# Patient Record
Sex: Female | Born: 1985 | Race: White | Hispanic: No | Marital: Single | Smoking: Former smoker
Health system: Southern US, Community
[De-identification: ages and names within clinical notes are randomized; demographics above are authoritative.]

## PROBLEM LIST (undated history)

## (undated) DIAGNOSIS — J45909 Unspecified asthma, uncomplicated: Secondary | ICD-10-CM

## (undated) DIAGNOSIS — F32A Depression, unspecified: Secondary | ICD-10-CM

## (undated) DIAGNOSIS — F329 Major depressive disorder, single episode, unspecified: Secondary | ICD-10-CM

## (undated) DIAGNOSIS — F909 Attention-deficit hyperactivity disorder, unspecified type: Secondary | ICD-10-CM

## (undated) DIAGNOSIS — D649 Anemia, unspecified: Secondary | ICD-10-CM

## (undated) DIAGNOSIS — K219 Gastro-esophageal reflux disease without esophagitis: Secondary | ICD-10-CM

## (undated) HISTORY — DX: Attention-deficit hyperactivity disorder, unspecified type: F90.9

## (undated) HISTORY — DX: Gastro-esophageal reflux disease without esophagitis: K21.9

## (undated) HISTORY — PX: TONSILLECTOMY: SUR1361

## (undated) HISTORY — DX: Major depressive disorder, single episode, unspecified: F32.9

## (undated) HISTORY — DX: Depression, unspecified: F32.A

## (undated) HISTORY — PX: DILATION AND CURETTAGE OF UTERUS: SHX78

## (undated) HISTORY — PX: WISDOM TOOTH EXTRACTION: SHX21

---
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
## 2006-01-19 ENCOUNTER — Ambulatory Visit: Payer: Self-pay | Admitting: Family Medicine

## 2006-04-05 ENCOUNTER — Ambulatory Visit: Payer: Self-pay | Admitting: Internal Medicine

## 2007-10-22 ENCOUNTER — Ambulatory Visit: Payer: Self-pay | Admitting: Family Medicine

## 2007-10-22 DIAGNOSIS — K219 Gastro-esophageal reflux disease without esophagitis: Secondary | ICD-10-CM | POA: Insufficient documentation

## 2007-10-22 DIAGNOSIS — F4310 Post-traumatic stress disorder, unspecified: Secondary | ICD-10-CM

## 2007-10-22 DIAGNOSIS — F909 Attention-deficit hyperactivity disorder, unspecified type: Secondary | ICD-10-CM | POA: Insufficient documentation

## 2007-10-22 DIAGNOSIS — J45909 Unspecified asthma, uncomplicated: Secondary | ICD-10-CM | POA: Insufficient documentation

## 2007-10-22 DIAGNOSIS — J309 Allergic rhinitis, unspecified: Secondary | ICD-10-CM | POA: Insufficient documentation

## 2007-12-03 ENCOUNTER — Telehealth (INDEPENDENT_AMBULATORY_CARE_PROVIDER_SITE_OTHER): Payer: Self-pay | Admitting: *Deleted

## 2007-12-07 ENCOUNTER — Ambulatory Visit: Payer: Self-pay | Admitting: Internal Medicine

## 2008-01-08 ENCOUNTER — Encounter: Payer: Self-pay | Admitting: Internal Medicine

## 2008-01-10 ENCOUNTER — Telehealth (INDEPENDENT_AMBULATORY_CARE_PROVIDER_SITE_OTHER): Payer: Self-pay | Admitting: *Deleted

## 2008-01-10 ENCOUNTER — Telehealth: Payer: Self-pay | Admitting: Internal Medicine

## 2008-02-07 ENCOUNTER — Telehealth: Payer: Self-pay | Admitting: Internal Medicine

## 2008-06-25 ENCOUNTER — Ambulatory Visit: Payer: Self-pay | Admitting: Internal Medicine

## 2008-07-02 ENCOUNTER — Telehealth: Payer: Self-pay | Admitting: Internal Medicine

## 2008-07-15 ENCOUNTER — Telehealth (INDEPENDENT_AMBULATORY_CARE_PROVIDER_SITE_OTHER): Payer: Self-pay | Admitting: *Deleted

## 2008-10-17 ENCOUNTER — Telehealth: Payer: Self-pay | Admitting: Internal Medicine

## 2008-11-24 ENCOUNTER — Encounter (INDEPENDENT_AMBULATORY_CARE_PROVIDER_SITE_OTHER): Payer: Self-pay | Admitting: *Deleted

## 2008-11-24 ENCOUNTER — Ambulatory Visit: Payer: Self-pay | Admitting: Internal Medicine

## 2008-11-24 DIAGNOSIS — D239 Other benign neoplasm of skin, unspecified: Secondary | ICD-10-CM | POA: Insufficient documentation

## 2008-11-24 DIAGNOSIS — L708 Other acne: Secondary | ICD-10-CM

## 2009-01-15 ENCOUNTER — Telehealth (INDEPENDENT_AMBULATORY_CARE_PROVIDER_SITE_OTHER): Payer: Self-pay | Admitting: *Deleted

## 2009-01-19 ENCOUNTER — Telehealth (INDEPENDENT_AMBULATORY_CARE_PROVIDER_SITE_OTHER): Payer: Self-pay | Admitting: *Deleted

## 2009-02-12 ENCOUNTER — Telehealth (INDEPENDENT_AMBULATORY_CARE_PROVIDER_SITE_OTHER): Payer: Self-pay | Admitting: *Deleted

## 2009-02-20 ENCOUNTER — Telehealth (INDEPENDENT_AMBULATORY_CARE_PROVIDER_SITE_OTHER): Payer: Self-pay | Admitting: *Deleted

## 2009-03-19 ENCOUNTER — Telehealth (INDEPENDENT_AMBULATORY_CARE_PROVIDER_SITE_OTHER): Payer: Self-pay | Admitting: *Deleted

## 2009-04-27 ENCOUNTER — Telehealth (INDEPENDENT_AMBULATORY_CARE_PROVIDER_SITE_OTHER): Payer: Self-pay | Admitting: *Deleted

## 2009-07-02 ENCOUNTER — Ambulatory Visit: Payer: Self-pay | Admitting: Internal Medicine

## 2009-09-03 ENCOUNTER — Telehealth (INDEPENDENT_AMBULATORY_CARE_PROVIDER_SITE_OTHER): Payer: Self-pay | Admitting: *Deleted

## 2009-10-02 ENCOUNTER — Telehealth (INDEPENDENT_AMBULATORY_CARE_PROVIDER_SITE_OTHER): Payer: Self-pay | Admitting: *Deleted

## 2009-12-02 ENCOUNTER — Telehealth (INDEPENDENT_AMBULATORY_CARE_PROVIDER_SITE_OTHER): Payer: Self-pay | Admitting: *Deleted

## 2009-12-09 ENCOUNTER — Ambulatory Visit: Payer: Self-pay | Admitting: Internal Medicine

## 2010-01-01 ENCOUNTER — Telehealth (INDEPENDENT_AMBULATORY_CARE_PROVIDER_SITE_OTHER): Payer: Self-pay | Admitting: *Deleted

## 2010-01-14 ENCOUNTER — Telehealth (INDEPENDENT_AMBULATORY_CARE_PROVIDER_SITE_OTHER): Payer: Self-pay | Admitting: *Deleted

## 2010-01-22 ENCOUNTER — Telehealth (INDEPENDENT_AMBULATORY_CARE_PROVIDER_SITE_OTHER): Payer: Self-pay | Admitting: *Deleted

## 2010-01-22 ENCOUNTER — Ambulatory Visit: Payer: Self-pay | Admitting: Internal Medicine

## 2010-01-27 ENCOUNTER — Encounter: Payer: Self-pay | Admitting: Internal Medicine

## 2010-02-01 ENCOUNTER — Telehealth (INDEPENDENT_AMBULATORY_CARE_PROVIDER_SITE_OTHER): Payer: Self-pay | Admitting: *Deleted

## 2010-02-12 ENCOUNTER — Telehealth (INDEPENDENT_AMBULATORY_CARE_PROVIDER_SITE_OTHER): Payer: Self-pay | Admitting: *Deleted

## 2010-02-12 ENCOUNTER — Ambulatory Visit: Payer: Self-pay | Admitting: Family

## 2010-06-05 ENCOUNTER — Inpatient Hospital Stay (HOSPITAL_COMMUNITY): Admission: AD | Admit: 2010-06-05 | Discharge: 2010-06-05 | Payer: Self-pay | Admitting: Obstetrics and Gynecology

## 2010-06-05 ENCOUNTER — Ambulatory Visit: Payer: Self-pay | Admitting: Gynecology

## 2010-06-05 DIAGNOSIS — O99340 Other mental disorders complicating pregnancy, unspecified trimester: Secondary | ICD-10-CM

## 2010-06-05 DIAGNOSIS — F410 Panic disorder [episodic paroxysmal anxiety] without agoraphobia: Secondary | ICD-10-CM

## 2010-08-01 ENCOUNTER — Inpatient Hospital Stay (HOSPITAL_COMMUNITY): Admission: AD | Admit: 2010-08-01 | Discharge: 2010-08-02 | Payer: Self-pay | Admitting: Obstetrics and Gynecology

## 2010-08-01 ENCOUNTER — Ambulatory Visit: Payer: Self-pay | Admitting: Family

## 2010-08-18 ENCOUNTER — Ambulatory Visit: Payer: Self-pay | Admitting: Obstetrics & Gynecology

## 2010-08-25 ENCOUNTER — Ambulatory Visit: Payer: Self-pay | Admitting: Gynecology

## 2010-08-25 ENCOUNTER — Inpatient Hospital Stay (HOSPITAL_COMMUNITY): Admission: AD | Admit: 2010-08-25 | Discharge: 2010-08-25 | Payer: Self-pay | Admitting: Obstetrics and Gynecology

## 2010-09-24 ENCOUNTER — Inpatient Hospital Stay (HOSPITAL_COMMUNITY)
Admission: AD | Admit: 2010-09-24 | Discharge: 2010-09-24 | Payer: Self-pay | Source: Home / Self Care | Admitting: Obstetrics and Gynecology

## 2010-09-27 ENCOUNTER — Inpatient Hospital Stay (HOSPITAL_COMMUNITY)
Admission: AD | Admit: 2010-09-27 | Discharge: 2010-09-27 | Payer: Self-pay | Source: Home / Self Care | Admitting: Obstetrics and Gynecology

## 2010-10-01 ENCOUNTER — Ambulatory Visit: Payer: Self-pay | Admitting: Obstetrics & Gynecology

## 2010-10-01 ENCOUNTER — Inpatient Hospital Stay (HOSPITAL_COMMUNITY)
Admission: AD | Admit: 2010-10-01 | Discharge: 2010-10-02 | Payer: Self-pay | Source: Home / Self Care | Admitting: Obstetrics and Gynecology

## 2010-10-09 ENCOUNTER — Inpatient Hospital Stay (HOSPITAL_COMMUNITY)
Admission: AD | Admit: 2010-10-09 | Discharge: 2010-10-13 | Payer: Self-pay | Source: Home / Self Care | Admitting: Obstetrics and Gynecology

## 2010-11-01 ENCOUNTER — Telehealth: Payer: Self-pay | Admitting: Internal Medicine

## 2010-11-05 ENCOUNTER — Telehealth: Payer: Self-pay | Admitting: Internal Medicine

## 2010-11-11 ENCOUNTER — Inpatient Hospital Stay (HOSPITAL_COMMUNITY): Admission: AD | Admit: 2010-11-11 | Discharge: 2010-08-18 | Payer: Self-pay | Admitting: Obstetrics and Gynecology

## 2010-11-17 ENCOUNTER — Ambulatory Visit: Payer: Self-pay | Admitting: Internal Medicine

## 2010-11-17 DIAGNOSIS — R519 Headache, unspecified: Secondary | ICD-10-CM | POA: Insufficient documentation

## 2010-11-17 DIAGNOSIS — R51 Headache: Secondary | ICD-10-CM

## 2010-11-17 DIAGNOSIS — F329 Major depressive disorder, single episode, unspecified: Secondary | ICD-10-CM

## 2010-12-16 ENCOUNTER — Telehealth: Payer: Self-pay | Admitting: Internal Medicine

## 2010-12-25 ENCOUNTER — Encounter: Payer: Self-pay | Admitting: Family Medicine

## 2010-12-29 ENCOUNTER — Emergency Department (HOSPITAL_COMMUNITY)
Admission: EM | Admit: 2010-12-29 | Discharge: 2010-12-29 | Payer: Self-pay | Source: Home / Self Care | Admitting: Emergency Medicine

## 2010-12-29 DIAGNOSIS — F329 Major depressive disorder, single episode, unspecified: Secondary | ICD-10-CM

## 2010-12-29 LAB — DIFFERENTIAL
Basophils Absolute: 0 10*3/uL (ref 0.0–0.1)
Basophils Relative: 0 % (ref 0–1)
Eosinophils Absolute: 0.1 10*3/uL (ref 0.0–0.7)
Eosinophils Relative: 1 % (ref 0–5)
Lymphocytes Relative: 28 % (ref 12–46)
Lymphs Abs: 2.6 10*3/uL (ref 0.7–4.0)
Monocytes Absolute: 0.8 10*3/uL (ref 0.1–1.0)
Monocytes Relative: 8 % (ref 3–12)
Neutro Abs: 5.8 10*3/uL (ref 1.7–7.7)
Neutrophils Relative %: 63 % (ref 43–77)

## 2010-12-29 LAB — COMPREHENSIVE METABOLIC PANEL
ALT: 26 U/L (ref 0–35)
AST: 34 U/L (ref 0–37)
Albumin: 4.2 g/dL (ref 3.5–5.2)
Alkaline Phosphatase: 58 U/L (ref 39–117)
BUN: 6 mg/dL (ref 6–23)
CO2: 25 mEq/L (ref 19–32)
Calcium: 9.9 mg/dL (ref 8.4–10.5)
Chloride: 108 mEq/L (ref 96–112)
Creatinine, Ser: 0.74 mg/dL (ref 0.4–1.2)
GFR calc Af Amer: >60 mL/min (ref >60)
GFR calc non Af Amer: >60 mL/min (ref >60)
Glucose, Bld: 82 mg/dL (ref 70–99)
Potassium: 3.8 mEq/L (ref 3.5–5.1)
Sodium: 141 mEq/L (ref 135–145)
Total Bilirubin: 0.6 mg/dL (ref 0.3–1.2)
Total Protein: 7.1 g/dL (ref 6.0–8.3)

## 2010-12-29 LAB — CBC
HCT: 36.4 % (ref 36.0–46.0)
Hemoglobin: 12.7 g/dL (ref 12.0–15.0)
MCH: 27.9 pg (ref 26.0–34.0)
MCHC: 34.9 g/dL (ref 30.0–36.0)
MCV: 80 fL (ref 78.0–100.0)
Platelets: 255 10*3/uL (ref 150–400)
RBC: 4.55 MIL/uL (ref 3.87–5.11)
RDW: 12.8 % (ref 11.5–15.5)
WBC: 9.3 10*3/uL (ref 4.0–10.5)

## 2010-12-29 LAB — RAPID URINE DRUG SCREEN, HOSP PERFORMED
Amphetamines: POSITIVE — AB
Barbiturates: NOT DETECTED
Benzodiazepines: POSITIVE — AB
Cocaine: NOT DETECTED
Opiates: POSITIVE — AB
Tetrahydrocannabinol: NOT DETECTED

## 2010-12-29 LAB — PREGNANCY, URINE: Preg Test, Ur: NEGATIVE

## 2010-12-29 LAB — SALICYLATE LEVEL: Salicylate Lvl: <4 mg/dL (ref 2.8–20.0)

## 2010-12-29 LAB — ETHANOL: Alcohol, Ethyl (B): <5 mg/dL (ref 0–10)

## 2010-12-29 LAB — ACETAMINOPHEN LEVEL: Acetaminophen (Tylenol), Serum: <10 ug/mL — ABNORMAL LOW (ref 10–30)

## 2010-12-30 NOTE — Consult Note (Addendum)
Connie Lewis, Connie Lewis            ACCOUNT NO.:  [id number]  MEDICAL RECORD NO.:  [id number]          PATIENT TYPE:  EMS  LOCATION:  ED                           FACILITY:  [facility]  PHYSICIAN:  Eulogio Ditch, MD DATE OF BIRTH:  Dec 30, 1985  DATE OF CONSULTATION:  12/29/2010 DATE OF DISCHARGE:  12/29/2010                                CONSULTATION   REASON FOR CONSULTATION:  Depressed mood/overdose.  HISTORY OF PRESENT ILLNESS:  A 24 year old white female who came to [facility] ED after she took 10 pills of Wellbutrin.  The patient told me it was a stupid act; she did not want to kill herself after taking the pills.  She called her father and her father brought her to the hospital.  The patient is on Wellbutrin 450 mg XL prescribed by her OB/GYN doctor in the outpatient setting.  The patient told me that she was going through a conflict with her boyfriend/fiance, who has a drinking problem, and that is why she became upset and took these pills. But, at this time, she told me that she had no intention to kill herself.  It was not planned or an intentional act.  The patient is very logical and goal directed during the interview, is not internally preoccupied, is not having any psychotic or manic symptoms.  PAST PSYCHIATRIC HISTORY:  The patient has a history of depression and is on Wellbutrin.  She was admitted one time at the age of 52 for suicidal ideation.  Currently, she is not being followed by a psychiatrist or counselor in the outpatient setting.  SUBSTANCE ABUSE HISTORY:  None.  SOCIAL HISTORY:  The patient lives with her fiance.  She has an 59-week- old son.  The patient told me she is worried that who is going to take care of her son if she is admitted to behavioral health.  She does not want to be admitted to behavioral health at this time because of this reason.  MENTAL STATUS EXAM:  The patient is calm, cooperative during the interview.  Fair eye contact,  pleasant on approach.  Mood is euthymic. Affect mood congruent.  Thought process logical and goal directed. Speech normal.  No psychomotor agitation or retardation noted.  Hygiene and grooming normal.  Thought content not suicidal or homicidal, not delusional.  Thought perception:  No audiovisual hallucination reported, not internally preoccupied.  Cognition:  Alert, awake and oriented x3. Memory:  Immediate, recent and remote intact.  Fund of knowledge is fair.  Attention and concentration are good.  Abstraction ability is good.  Insight and judgment intact.  DIAGNOSES:  AXIS I:  Depressive disorder, not otherwise specified.  Rule out major depressive disorder, recurrent type. AXIS II:  Deferred. AXIS III:  See medical notes. AXIS IV:  Recent conflict with the fiance. AXIS V:  51.  RECOMMENDATIONS: 1. I spoke with her father, who at this time has no safety concern for     the patient and he does not think that the patient was trying to     kill herself or going to kill herself in the future.  Father wants  to take the patient home at this time and agreed to pay for the     therapy in the outpatient setting. 2. I told [facility] member, Clydie Braun, to speak with the father and the patient     together and to determine the safety plan and to follow up in the     outpatient setting.  Clydie Braun spoke with me later on that she had     given the family all the information that if she feels depressed or     suicidal where she can call, and she can call the ER and can come     back.  At this time, the counselor and myself have gone over all     the safety concerns for the patient, and we have decided the     patient can be followed in the outpatient setting as she is not     acutely suicidal, manic or psychotic.  She has an 83-week-old son     and she want to go and take care of the son.  Father is also     involved in the treatment, so this patient can be managed outside     at this  time.     Eulogio Ditch, MD     SA/MEDQ  D:  12/29/2010  T:  12/29/2010  Job:  [id number]  Electronically Signed by Eulogio Ditch  on 12/30/2010 05:33:07 AM

## 2011-01-04 NOTE — Medication Information (Signed)
Summary: Approval for Singulair/[facility]  Approval for Singulair/[facility]   Imported By: Lanelle Bal 02/01/2010 12:54:57  _____________________________________________________________________  External Attachment:    Type:   Image     Comment:   External Document

## 2011-01-04 NOTE — Assessment & Plan Note (Signed)
Summary: allergies//lch   Vital Signs:  Patient profile:   25 year old female Height:      67 inches Weight:      145 pounds Temp:     98.8 degrees F BP sitting:   110 / 70  Vitals Entered By: Shary Decamp (January 22, 2010 2:45 PM) CC: ALLERGY SXS, NO RELIEF WITH CLARITIN   History of Present Illness: was seen 12/09/2009 prescribed antibiotics and steroids she did not improve the steroids keep her up all night  she continue with allergy symptoms: Eyes swollen and itching nose running and itching (+) sneezing (+)  frontal headache which is intense at times  Current Medications (verified): 1)  Aviane 0.1-20 Mg-Mcg Tabs (Levonorgestrel-Ethinyl Estrad) .... Once Daily 2)  Amphetamine Salt Combo 20 Mg  Tabs (Amphetamine-Dextroamphetamine) .... Bid 3)  Mvi 4)  Claritin 10 Mg Tabs (Loratadine) .Marland Kitchen.. 1 By Mouth Daily (Otc)  Allergies (verified): No Known Drug Allergies  Review of Systems      See HPI       she had a virus last week, had fever and cough the acute symptoms are resolving at this point  Physical Exam  General:  alert and well-developed.   Head:  face symmetric, tender in both maxillary sinuses Eyes:  EOMI Ears:  R ear normal and L ear normal.   Nose:  slightly congested Mouth:  no redness or discharge tonge normal to inspection Lungs:  normal respiratory effort, no intercostal retractions, no accessory muscle use, and normal breath sounds.     Impression & Recommendations:  Problem # 1:  ALLERGIC RHINITIS (ICD-477.9) symptoms increase for several weeks she still has some evidence of acute maxillary sinusitis Plan: Switch to Zyrtec Singulair, astepro, avelox  needs to change enviroment (moved to old appartment few months ago) The following medications were removed from the medication list:    Nasacort Aq 55 Mcg/act Aers (Triamcinolone acetonide(nasal)) .Marland Kitchen... 2 puff on each side of the nose once daily Her updated medication list for this problem  includes:    Zyrtec Allergy 10 Mg Caps (Cetirizine hcl) .Marland Kitchen... 1 a day    Astepro 0.15 % Soln (Azelastine hcl) .Marland Kitchen... 2 sprays in each side of the nose twice a day  Problem # 2:  MAXILLARY SINUSITIS (ICD-473.0) see #1 The following medications were removed from the medication list:    Nasacort Aq 55 Mcg/act Aers (Triamcinolone acetonide(nasal)) .Marland Kitchen... 2 puff on each side of the nose once daily    Amoxicillin 500 Mg Caps (Amoxicillin) .Marland Kitchen... 2 by mouth two times a day x 10 days Her updated medication list for this problem includes:    Astepro 0.15 % Soln (Azelastine hcl) .Marland Kitchen... 2 sprays in each side of the nose twice a day    Avelox 400 Mg Tabs (Moxifloxacin hcl) .Marland Kitchen... 1    by mouth daily  Complete Medication List: 1)  Aviane 0.1-20 Mg-mcg Tabs (Levonorgestrel-ethinyl estrad) .... Once daily 2)  Amphetamine Salt Combo 20 Mg Tabs (Amphetamine-dextroamphetamine) .... Bid 3)  Mvi  4)  Zyrtec Allergy 10 Mg Caps (Cetirizine hcl) .Marland Kitchen.. 1 a day 5)  Singulair 10 Mg Tabs (Montelukast sodium) .... One p.o. daily 6)  Astepro 0.15 % Soln (Azelastine hcl) .... 2 sprays in each side of the nose twice a day 7)  Avelox 400 Mg Tabs (Moxifloxacin hcl) .Marland Kitchen.. 1    by mouth daily 8)  Alocril 2 % Soln (Nedocromil sodium) .... 2 drops on  each eye b.i.d. 9)  Fluconazole 150  Mg Tabs (Fluconazole) .Marland Kitchen.. 1 by mouth once daily x 2  Patient Instructions: 1)  start  ZYRTEC OTC one a day 2)  start SINGULAIR 10 MG one with a 3)  start ASTEPRO  2 sprays in each side of the nose twice a day 4)  ALOCRIL   2 drops on  each eye two times a day  5)  AVELOX 400 MG x 1 wek  6)   call if no better in two weeks Prescriptions: FLUCONAZOLE 150 MG TABS (FLUCONAZOLE) 1 by mouth once daily x 2  #2 x 0   Entered and Authorized by:   [name]   Signed by:   [name] on 01/22/2010   Method used:   [facility]        [facility] [phone]* (retail)       [address]       [place], [place]  [address]       Ph: [phone]       Fax: [phone]   RxID:   [id number] ALOCRIL 2 % SOLN (NEDOCROMIL SODIUM) 2 drops on  each eye b.i.d.  #1 x 3   Entered and Authorized by:   [name]   Signed by:   [name] on 01/22/2010   Method used:   Electronically to        [facility] [phone]* (retail)       [address]       [place], [place]  [address]       Ph: [phone]       Fax: [phone]   RxID:   [id number] AVELOX 400 MG TABS (MOXIFLOXACIN HCL) 1    by mouth daily  #7 x 0   Entered and Authorized by:   [name]   Signed by:   [name] on 01/22/2010   Method used:   Electronically to        [facility] [phone]* (retail)       [address]       [place], [place]  [address]       Ph: [phone]       Fax: [phone]   RxID:   [id number] ASTEPRO 0.15 % SOLN (AZELASTINE HCL) 2 sprays in each side of the nose twice a day  #1 x 3   Entered and Authorized by:   [name]   Signed by:   [name] on 01/22/2010   Method used:   Electronically to        [facility] [phone]* (retail)       [address]       [place], [place]  [address]       Ph: [phone]       Fax: [phone]   RxID:   [id number] SINGULAIR 10 MG TABS (MONTELUKAST SODIUM) one p.o. daily  #30 x 3   Entered and Authorized by:   [name]   Signed by:   [name] on 01/22/2010   Method used:   Electronically to        [facility] [phone]* (retail)       [address]  [place], [place]  [address]       Ph: [phone]       Fax: [phone]   RxID:   [id number]

## 2011-01-04 NOTE — Progress Notes (Signed)
Summary: singulair approved  Phone Note Refill Request Message from:  Fax from Pharmacy on January 22, 2010 4:06 PM  Refills Requested: Medication #1:  SINGULAIR 10 MG TABS one p.o. daily prior auth fax [phone]   Method Requested: Fax to Local Pharmacy Initial call taken by: Barb Merino,  January 22, 2010 4:07 PM  Follow-up for Phone Call        prior auth initiated with [facility] Shary Decamp  January 22, 2010 4:26 PM  Medication has been approved from 01/27/10 until 01/27/11 -- pharmacy advised Shary Decamp  January 27, 2010 3:19 PM

## 2011-01-04 NOTE — Progress Notes (Signed)
Summary: DOES SHE NEED ANOTHER APPOINTMENT  Phone Note Call from Patient Call back at Home Phone [phone]   Caller: Patient Summary of Call: PATIENT SAW DR PAZ THIS WEEK AND THEY DISCUSSED HER AMPHET MEDICATION----SINCE SHE JUST SAW DR PAZ, DOES SHE NEED TO SCHEDULE A NEW APPOINTMENT SOON??   PLEASE LET HER KNOW Initial call taken by: Jerolyn Shin,  January 01, 2010 4:39 PM  Follow-up for Phone Call        pt informed she does not need appt, amphentamine rx was discussed at [facility]  12/09/09 Follow-up by: Kandice Hams,  January 01, 2010 4:52 PM

## 2011-01-04 NOTE — Medication Information (Signed)
Summary: Prior Authorization for Singulair/[facility]  Prior Authorization for [employer]   Imported By: Lanelle Bal 01/27/2010 11:39:48  _____________________________________________________________________  External Attachment:    Type:   Image     Comment:   External Document

## 2011-01-04 NOTE — Progress Notes (Signed)
Summary: adderall rx  Phone Note Refill Request Call back at Home Phone [phone] Message from:  Patient  Refills Requested: Medication #1:  AMPHETAMINE SALT COMBO 20 MG  TABS bid Initial call taken by: Kandice Hams,  February 01, 2010 2:13 PM  Follow-up for Phone Call        pt informed rx will be ready for pickup today.Kandice Hams  February 01, 2010 2:19 PM  Follow-up by: Kandice Hams,  February 01, 2010 2:19 PM    Prescriptions: AMPHETAMINE SALT COMBO 20 MG  TABS (AMPHETAMINE-DEXTROAMPHETAMINE) bid  #60 x 0   Entered by:   Kandice Hams   Authorized by:   [name]   Signed by:   Kandice Hams on 02/01/2010   Method used:   Print then Give to Patient   RxID:   [id number]

## 2011-01-04 NOTE — Progress Notes (Signed)
Summary:  [facility]  Phone Note Outgoing Call   Call placed by: Jeremy Johann CMA,  November 05, 2010 11:39 AM Details for Reason: [facility] Triage Call Report Triage Record Num: [id number] Operator: Peri Jefferson Patient Name: Connie Lewis Call Date & Time: 11/04/2010 8:47:30PM Patient Phone: [phone] PCP: Sonda Primes Patient Gender: Female PCP Fax : [phone] Patient DOB: June 23, 1986 Practice Name: [facility] Reason for Call: Mardella Layman calling because she developed facial burning and itching approx 1 1/2 hrs ago. States that her face was red. Face and neck are still red with bumps. Looks like hives. Afebrile. Did take Vicodin for a HA today but has taken Vicodin before. Face and lips are swollen but no difficulty breathing. Advised ED. Protocol(s) Used: Hives Recommended Outcome per Protocol: Call Provider within 4 Hours Reason for Outcome: First episode of hives with no other symptoms occurring within minutes to several hours after exposure to an allergen Care Advice: Call EMS 911 if develop signs and symptoms of anaphylaxis within minutes to several hours of exposure: severe difficulty breathing; rapid, weak or irregular pulse; pruritus, urticaria, swelling of face, lips, tongue, or throat causing tightness or difficulty swallowing; abdominal cramping, nausea, vomiting or diarrhea.  ~ 12/ Summary of Call: left message to call office to check status of pt. No documentation in E-chart that pt went to ED...........Marland KitchenFelecia Deloach CMA  November 05, 2010 11:42 AM   Pt states that she did not go to ED. Pt took benadryl and waited it out. Rash and all other symptoms have resolved. Pt advise to call office if symptoms return ...........Marland KitchenFelecia Deloach CMA  November 08, 2010 1:49 PM      Appended Document:  [facility] please rec a OV,  we need to try to see what caused the reaction  Appended Document: lm    12/6    Phone  Note Other Incoming   Details for Reason: please rec a OV,  we need to try to see what caused the reaction [name] MD  November 11, 2010 10:27 AM  Summary of Call: left message on voicemail to call back to office. Lucious Groves CMA  November 09, 2010 11:34 AM   Follow-up for Phone Call        I spoke w/ pt she states that she has had no further reactions. She has a newborn baby and it is hard to go places. She declines an appt right now.  Follow-up by: Army Fossa CMA,  November 09, 2010 1:32 PM  Additional Follow-up for Phone Call Additional follow up Details #1::        noted  Additional Follow-up by: [facility] Paz MD,  November 11, 2010 10:27 AM

## 2011-01-04 NOTE — Progress Notes (Signed)
Summary: FLU SX/HOP CAN YOU ADVISE PLEASE IN ABSENCE OF PAZ  Phone Note Call from Patient   Caller: Patient Summary of Call: pt called and left msg, sister dx with flu and she has signs, works at a pediatric office and wanted to know if she can rx sent to [facility] without office visit. called and got VM, left msg for pt to call .Kandice Hams  January 14, 2010 1:24  PT CALLED BACK SISTER WAS DX WITH THE FLU, PT HAS FLU SX FEVER 100.2, COUGH BODY ACHES,  [facility] [address]  Initial call taken by: Kandice Hams,  January 14, 2010 1:24 PM  Follow-up for Phone Call        Tamiflu 75 mg two times a day #10 Follow-up by: Marga Melnick MD,  January 14, 2010 3:49 PM  Additional Follow-up for Phone Call Additional follow up Details #1::        Left message on VM informing patient RX sent in, Ok'd by Dr.Hopper b/c Dr.Paz is out of the office Additional Follow-up by: Shonna Chock,  January 14, 2010 5:16 PM    New/Updated Medications: TAMIFLU 75 MG CAPS (OSELTAMIVIR PHOSPHATE) 1 by mouth two times a day Prescriptions: TAMIFLU 75 MG CAPS (OSELTAMIVIR PHOSPHATE) 1 by mouth two times a day  #10 x 0   Entered by:   Shonna Chock   Authorized by:   Marga Melnick MD   Signed by:   Shonna Chock on 01/14/2010   Method used:   Electronically to        [facility] [phone]* (retail)       [address]       [place], [place]  [address]       Ph: [phone]       Fax: [phone]   RxID:   [id number]

## 2011-01-04 NOTE — Progress Notes (Signed)
  Phone Note Call from Patient Call back at [facility] Phone [phone]   Summary of Call: Pt called had appt today with NP, and missed it, found out she is pregnant, taking multiple meds. Has appt scheduled with OB next month, wanted to know what meds she can and cannot take, she has stopped all meds for now,  Recommend pt to s/w OB nurse who can tell her what meds she should not be taking 1st trimester pt agreed will call on monday.Kandice Hams  February 12, 2010 4:51 PM  Initial call taken by: Kandice Hams,  February 12, 2010 4:51 PM

## 2011-01-04 NOTE — Assessment & Plan Note (Signed)
Summary: sinus infection/kdc   Vital Signs:  Patient profile:   25 year old female Height:      67 inches Weight:      145.8 pounds BMI:     22.92 Temp:     98.5 degrees F oral BP sitting:   110 / 80  (left arm) Cuff size:   large  Vitals Entered By: Shary Decamp (December 09, 2009 2:14 PM) CC: acute only Comments  - feels like she has had a sinus infection for a "couple of month" - has had HA, sinus pain, pressure  - eyes sore  - has had blisters in her mouth, around lips x couple weeks Shary Decamp  December 09, 2009 2:15 PM    History of Present Illness: acute only    - feels like she has had a sinus infection for a "couple of month" - has had a frontal HA, sinus pain, pressure  - eyes sore  - has had blisters in her mouth, around lips x couple weeks  Current Medications (verified): 1)  Loestrin 1/20 (21) 1-20 Mg-Mcg  Tabs (Norethindrone Acet-Ethinyl Est) 2)  Amphetamine Salt Combo 20 Mg  Tabs (Amphetamine-Dextroamphetamine) .... Bid 3)  Nasacort Aq 55 Mcg/act Aers (Triamcinolone Acetonide(Nasal)) .... 2 Puff On Each Side of The Nose Once Daily 4)  Mvi  Allergies (verified): No Known Drug Allergies  Past History:  Past Medical History: Reviewed history from 11/24/2008 and no changes required. ADHD Allergic rhinitis Asthma GERD G0  Review of Systems General:  Denies chills and fever. ENT:  nasal d/c-- usually clear. Resp:  on-off cough, chest congestion. Allergy:  (+) itchy eyes and nose moved to an old appartment in July 2010,patient wonders if that is making her allergies worse.  Physical Exam  General:  alert, well-developed, and well-nourished.   Head:  face symetric, tender at maxilary sinuses L>R Nose:  congested  Mouth:  no red or d/c tongue normal, slightly  prominent vallate papillae Lungs:  normal respiratory effort, no intercostal retractions, no accessory muscle use, and normal breath sounds.   Heart:  normal rate, regular rhythm, and no  murmur.     Impression & Recommendations:  Problem # 1:  MAXILLARY SINUSITIS (ICD-473.0) allergic and/or allergic sinusitis moved to an old appartment which may be exhacerbating her symptoms plan: abx -steroid-claritin likes a diflucan Rx as well: done  Her updated medication list for this problem includes:    Nasacort Aq 55 Mcg/act Aers (Triamcinolone acetonide(nasal)) .Marland Kitchen... 2 puff on each side of the nose once daily    Amoxicillin 500 Mg Caps (Amoxicillin) .Marland Kitchen... 2 by mouth two times a day x 10 days  Problem # 2:  ADHD (ICD-314.01) likes to change to extended release as long as it is generic will call and ask for the ER version of her amphetamines in few weeks when she is due for RF and will try that  Complete Medication List: 1)  Loestrin 1/20 (21) 1-20 Mg-mcg Tabs (Norethindrone acet-ethinyl est) 2)  Amphetamine Salt Combo 20 Mg Tabs (Amphetamine-dextroamphetamine) .... Bid 3)  Nasacort Aq 55 Mcg/act Aers (Triamcinolone acetonide(nasal)) .... 2 puff on each side of the nose once daily 4)  Mvi  5)  Amoxicillin 500 Mg Caps (Amoxicillin) .... 2 by mouth two times a day x 10 days 6)  Prednisone 10 Mg Tabs (Prednisone) .... 4 by mouth once daily x2, 3x2, 2x2, 1x1 7)  Claritin 10 Mg Tabs (Loratadine) .Marland Kitchen.. 1 by mouth daily (otc) 8)  Diflucan 150 Mg Tabs (Fluconazole) .Marland Kitchen.. 1 by mouth once daily x 1  Patient Instructions: 1)  amoxicillin, antibiotic 2)  nasal sprays every day 3)  claritin OTC 4)  prednisone x few days 5)  call if no better in few days or if symptoms resurface  Prescriptions: DIFLUCAN 150 MG TABS (FLUCONAZOLE) 1 by mouth once daily x 1  #1 x 0   Entered and Authorized by:   Elita Quick E. Simon Aaberg MD   Signed by:   Nolon Rod. Thomasine Klutts MD on 12/09/2009   Method used:   Electronically to        [facility] [phone]* (retail)       [address]       [place], [place]  [address]       Ph: [phone]       Fax: [phone]   RxID:    [id number] PREDNISONE 10 MG TABS (PREDNISONE) 4 by mouth once daily x2, 3x2, 2x2, 1x1  #20 x 0   Entered and Authorized by:   Elita Quick E. Cyncere Ruhe MD   Signed by:   Nolon Rod. Samaia Iwata MD on 12/09/2009   Method used:   Electronically to        [facility] [phone]* (retail)       [address]       [place], [place]  [address]       Ph: [phone]       Fax: [phone]   RxID:   [id number] AMOXICILLIN 500 MG CAPS (AMOXICILLIN) 2 by mouth two times a day x 10 days  #40 x 0   Entered and Authorized by:   Nolon Rod. Amirr Achord MD   Signed by:   Nolon Rod. Autum Benfer MD on 12/09/2009   Method used:   Electronically to        [facility] [phone]* (retail)       [address]       [place], [place]  [address]       Ph: [phone]       Fax: [phone]   RxID:   [id number]

## 2011-01-04 NOTE — Progress Notes (Signed)
Summary: Adderall and breast feeding  Phone Note Call from Patient Call back at Home Phone [phone]   Summary of Call: Patient has had her baby (3 weeks ago) and will be returning back to school in [place]. Patient would like to know if it is ok to resume the Adderall and can she take it while breast feeding. Her gyn advised that she call her pcp. She notes that Adderall has worked the best for her and she is aware that she will most likely need appt to resume the med. Please advise. Initial call taken by: Lucious Groves CMA,  November 01, 2010 2:32 PM  Follow-up for Phone Call        Adderall IS NOT  safe during breast-feeding. I don't know of any safe medicines for the treatment of ADHD during breast-feeding Follow-up by: Keishaun Hazel E. Salik Grewell MD,  November 01, 2010 5:19 PM  Additional Follow-up for Phone Call Additional follow up Details #1::        left message on voicemail to call back to office. Lucious Groves CMA  November 02, 2010 8:20 AM   Patient notified. Lucious Groves CMA  November 02, 2010 8:37 AM

## 2011-01-05 ENCOUNTER — Ambulatory Visit (INDEPENDENT_AMBULATORY_CARE_PROVIDER_SITE_OTHER): Payer: 59 | Admitting: Internal Medicine

## 2011-01-05 ENCOUNTER — Encounter: Payer: Self-pay | Admitting: Internal Medicine

## 2011-01-05 DIAGNOSIS — F329 Major depressive disorder, single episode, unspecified: Secondary | ICD-10-CM

## 2011-01-05 DIAGNOSIS — M549 Dorsalgia, unspecified: Secondary | ICD-10-CM

## 2011-01-05 DIAGNOSIS — R259 Unspecified abnormal involuntary movements: Secondary | ICD-10-CM

## 2011-01-05 DIAGNOSIS — F909 Attention-deficit hyperactivity disorder, unspecified type: Secondary | ICD-10-CM

## 2011-01-06 ENCOUNTER — Encounter: Payer: Self-pay | Admitting: Internal Medicine

## 2011-01-06 ENCOUNTER — Ambulatory Visit (INDEPENDENT_AMBULATORY_CARE_PROVIDER_SITE_OTHER)
Admission: RE | Admit: 2011-01-06 | Discharge: 2011-01-06 | Disposition: A | Discharge status: Home / Self Care | Payer: 59 | Source: Ambulatory Visit | Attending: Internal Medicine | Admitting: Internal Medicine

## 2011-01-06 ENCOUNTER — Other Ambulatory Visit: Payer: Self-pay | Admitting: Internal Medicine

## 2011-01-06 DIAGNOSIS — M549 Dorsalgia, unspecified: Secondary | ICD-10-CM

## 2011-01-06 NOTE — Assessment & Plan Note (Signed)
Summary: med check/cbs-rescd cbs   Vital Signs:  Patient profile:   25 year old female Weight:      177.38 pounds BMI:     27.88 Temp:     98.6 degrees F oral Pulse rate:   101 / minute Pulse rhythm:   regular BP sitting:   100 / 74  (left arm) Cuff size:   large  Vitals Entered By: Army Fossa CMA (November 17, 2010 2:00 PM) CC: Discuss adderall    History of Present Illness:  ROV since the last office visit, her pregnancy went well, she had a vaginal delivery 5 weeks ago. She was off ADD medications and is ready to go back.  During her pregnancy she developed migraine headaches, was prescribed Vicodin and Xanax ( per patient) which she takes rarely now  she was also prescribed Wellbutrin for depression during her pregnancy, she also felt slightly blue in the postpartum period.   Allergies: No Known Drug Allergies  Past History:  Past Medical History: G1P1 migraine HAs, dx during pregnancy 2011 (on vicodin -xanax) ADHD Allergic rhinitis Asthma GERD Depression,  dx during her pregnancy in 2000 and  Past Surgical History: no major surgeries   Social History: engaged tobacco-- quit ETOH-- rarely   2 jobs plus college  Review of Systems Psych:  depression well controlled No anxiety ADD not well controlled at present, while she was pregnant   and off medication she tried to work and study and it was quite difficult. Allergy:  allergies much improved.  Physical Exam  General:  alert, well-developed, and well-nourished.   Lungs:  normal respiratory effort, no intercostal retractions, no accessory muscle use, and normal breath sounds.   Heart:  normal rate, regular rhythm, and no murmur.   Extremities:  no lower extremity edema Psych:  Oriented X3, memory intact for recent and remote, normally interactive, good eye contact, not anxious appearing, and not depressed appearing.     Impression & Recommendations:  Problem # 1:  ADHD (ICD-314.01) patient  was not taking medication  while  pregnant, she plans to go back to work and study in January and likes to restart her medication then. She is still breast-feeding but will stop once she starts medication. she knows there is a contraindication between adderall  and breast-feeding she was taking adderal 20 mg generic, would like to  retry the  extended release. I talked to  her about the shortage of medications and possibly trying Vyvanse but she is not enthusiastic about it. She reminded me that she cannot take ritalin or straterra plan: Rx adderall xr 20 ( I did initially a Rx for generic adderal, it was  destroyed and gave her a bran medically neccesary Rx)  Problem # 2:  HEADACHE (ICD-784.0)  in my opinion Vicodin and Xanax of not  good alternatives for  migraine treatment. Pt aware  Problem # 3:  DEPRESSION (ICD-311) on Wellbutrin, symptoms well controlled per patient Her updated medication list for this problem includes:    Wellbutrin Xl 150 Mg Xr24h-tab (Bupropion hcl) .Marland Kitchen... 2 am, 1 pm  Complete Medication List: 1)  Mvi  2)  Zyrtec Allergy 10 Mg Caps (Cetirizine hcl) .Marland Kitchen.. 1 a day 3)  Singulair 10 Mg Tabs (Montelukast sodium) .... One p.o. daily 4)  Wellbutrin Xl 150 Mg Xr24h-tab (Bupropion hcl) .... 2 am, 1 pm 5)  Adderall Xr 20 Mg Xr24h-cap (Amphetamine-dextroamphetamine) .Marland Kitchen.. 1 by mouth once daily  Patient Instructions: 1)  do not take Adder  or any ADHD medication if you are breast-feed. 2)  Please come back in 3 months for reassessment Prescriptions: ADDERALL XR 20 MG XR24H-CAP (AMPHETAMINE-DEXTROAMPHETAMINE) 1 by mouth once daily Brand medically necessary #30 x 0   Entered and Authorized by:   [name]   Signed by:   [name] on 11/17/2010   Method used:   Print then Give to Patient   RxID:   [id number] AMPHETAMINE-DEXTROAMPHETAMINE 20 MG XR24H-CAP (AMPHETAMINE-DEXTROAMPHETAMINE) 1 by mouth once daily  #30 x 0   Entered and Authorized by:   [name]    Signed by:   [name] on 11/17/2010   Method used:   Print then Give to Patient   RxID:   [id number]    Orders Added: 1)  Est. Patient Level III [44034]

## 2011-01-06 NOTE — Progress Notes (Signed)
Summary: Adderall dose  Phone Note Call from Patient Call back at Home Phone [phone]   Summary of Call: Patient left message on triage that she has been taking the Adderall and what she was given doesn't seem to be working as well as it used to. She ? if she needs higher dose. Please advise. Initial call taken by: Lucious Groves CMA,  December 16, 2010 2:07 PM  Follow-up for Phone Call        we can go up to 30mg . if that is not working, may need a [address] E. Gerard Bonus MD  December 17, 2010 9:16 AM   Additional Follow-up for Phone Call Additional follow up Details #1::        Left message on voicemail notifying the patient. Lucious Groves CMA  December 17, 2010 10:24 AM     New/Updated Medications: ADDERALL XR 30 MG XR24H-CAP (AMPHETAMINE-DEXTROAMPHETAMINE) 1 by mouth once daily Prescriptions: ADDERALL XR 30 MG XR24H-CAP (AMPHETAMINE-DEXTROAMPHETAMINE) 1 by mouth once daily  #30 x 0   Entered and Authorized by:   Nolon Rod. Kashauna Celmer MD   Signed by:   Lucious Groves CMA on 12/17/2010   Method used:   Print then Give to Patient   RxID:   [id number]

## 2011-01-07 ENCOUNTER — Telehealth (INDEPENDENT_AMBULATORY_CARE_PROVIDER_SITE_OTHER): Payer: Self-pay | Admitting: *Deleted

## 2011-01-10 ENCOUNTER — Telehealth: Payer: Self-pay | Admitting: Internal Medicine

## 2011-01-12 NOTE — Progress Notes (Signed)
Summary: labs/ Med Question  Phone Note Outgoing Call   Summary of Call: received labs from [facility]: CBC normal, TSH 1.3 advise patient: labs wnl Connie E. Paz MD  January 07, 2011 11:14 AM   Follow-up for Phone Call        Patient is aware of lab results.   She did have one question though about her adderall. She is on 30mg  ER and she said it is only lasting her about 6 hours at a time. She says that some days she has night classes and the medicine is well out of her system by then. She wanted to know if there was something she could do about that. Please advise.  Follow-up by: [name],  January 07, 2011 11:22 AM  Additional Follow-up for Phone Call Additional follow up Details #1::        options are: Take the Adderall XR 30 mg later in the day the times she has classes. We could add  Adderall 10 mg to take one tablet  in the afternoon the days she has nighttime classes. it would be a trial and see if that works Additional Follow-up by: [name],  January 07, 2011 11:28 AM    Additional Follow-up for Phone Call Additional follow up Details #2::    Spoke with patient and she would prefer to add the adderall 10mg . She said she has tried to take the 30 ER later in the day and it didnt help. She needs the new 10mg  rx to be generic. She just needs a phone call when it is ready to pick up.  Follow-up by: [name],  January 07, 2011 11:32 AM  Additional Follow-up for Phone Call Additional follow up Details #3:: Details for Additional Follow-up Action Taken: done  [name] MD  January 07, 2011 1:23 PM   Rx will be placed up front for pick-up, patient has been notified.[name]  January 07, 2011 1:31 PM  New/Updated Medications: AMPHETAMINE-DEXTROAMPHETAMINE 10 MG TABS (AMPHETAMINE-DEXTROAMPHETAMINE) one by mouth in the afternoon as needed Prescriptions: AMPHETAMINE-DEXTROAMPHETAMINE 10 MG TABS (AMPHETAMINE-DEXTROAMPHETAMINE) one by mouth in the afternoon  as needed  #30 x 0   Entered and Authorized by:   [name]   Signed by:   [name] on 01/07/2011   Method used:   Print then Give to Patient   RxID:   [id number]

## 2011-01-12 NOTE — Assessment & Plan Note (Signed)
Summary: backpain/kn   Vital Signs:  Patient profile:   25 year old female Height:      67 inches (170.18 cm) Weight:      169.38 pounds (76.99 kg) BMI:     26.62 Temp:     99.0 degrees F (37.22 degrees C) oral BP sitting:   114 / 80  (left arm) Cuff size:   regular  Vitals Entered By: Lucious Groves CMA (January 05, 2011 2:55 PM) CC: C/O back pain and increased shakiness/off balance./kb Is Patient Diabetic? No Pain Assessment Patient in pain? yes     Location: back Intensity: 4 Type: ache Onset of pain  continued since deliver of baby 3 months ago Comments Patient notes that she did have sciatica during pregnancy. She denies fever, increased urinary frequency, and dysuria.   History of Present Illness: CC back pain symptoms started  ~ 7-11, she delivered a baby 11-11 and pain continue  pain located at mid lower back, used to radiated to buttocks initially but currently her pain is not radiating. discomfort is steady, worse in the morning, worse with bending or  by staying in the same position. Although the pain is in the mid lower  back, there is one particular spot that is quite tender at the left sacroiliac joint.     ROS no fever still breast feeding on-off (on adderal, told by her lactation consultant it was ok breast feed the day she doesn't take adderall ) also has other ill-defined symptoms "shaky at times, occasionally dizzy" Denies tingling in his toes No bladder or bowel incontinence When asked, admits to some depression, seen a therapist no suicidal at present   Current Medications (verified): 1)  Mvi 2)  Wellbutrin Xl 150 Mg Xr24h-Tab (Bupropion Hcl) .... 2 Am, 1 Pm 3)  Adderall Xr 30 Mg Xr24h-Cap (Amphetamine-Dextroamphetamine) .Marland Kitchen.. 1 By Mouth Once Daily. Medically Necessary For Brand Name Only.  Allergies (verified): No Known Drug Allergies  Past History:  Past Medical History: Reviewed history from 11/17/2010 and no changes  required. G1P1 migraine HAs, dx during pregnancy 2011 (on vicodin -xanax) ADHD Allergic rhinitis Asthma GERD Depression,  dx during her pregnancy in 2000 and  Past Surgical History: Reviewed history from 11/17/2010 and no changes required. no major surgeries   Social History:  1 child , boy 10-2010 tobacco-- quit ETOH-- rarely   2 jobs plus college  Physical Exam  General:  alert and well-developed.   Neck:  thyroid gland not enlarged, not tender or warm Msk:  slightly tender at the sacroiliac area, left> Right Extremities:  no lower extremity edema Neurologic:  alert & oriented X3, strength normal in all extremities, gait normal, and DTRs symmetrical and normal.  straight leg test negative. upper extremities with a very mild tremor. Psych:  Oriented X3, memory intact for recent and remote, normally interactive, and good eye contact.  slightly anxious?   Impression & Recommendations:  Problem # 1:  BACK PAIN (ICD-724.5) Assessment New back pain for several months, no radicular features, slightly tender at the sacroiliac area. plan: X-ray, naproxen, heating pad. If not better, will  need  referral.  Her updated medication list for this problem includes:    Naproxen 500 Mg Tabs (Naproxen) ..... One by mouth twice a day. take with food    Hydrocodone-acetaminophen 5-325 Mg Tabs (Hydrocodone-acetaminophen) .Marland Kitchen... Per gyn for has  Orders: T-Sacroiliac Joints (16109UE)  Problem # 2:  ADHD (ICD-314.01) patient is taking Adderall and breast-feeding, was told by her  lactation  consultant that it was okay. strongly recommend to consult with her pediatrician before proceeding with that practice  Problem # 3:  TREMOR (ICD-781.0) Assessment: New has noted some tremor and dizziness She has been recently pregnant, check a TSH and a CBC to be sure there is no anemia or thyroid disease. She knows to come in if no better soon  Orders: Venipuncture (04540) TLB-TSH (Thyroid  Stimulating Hormone) (84443-TSH) TLB-CBC Platelet - w/Differential (85025-CBCD)  Problem # 4:  DEPRESSION (ICD-311) symptoms got worse after her pregnancy, currently not suicidal and talking to a counselor. Her updated medication list for this problem includes:    Wellbutrin Xl 150 Mg Xr24h-tab (Bupropion hcl) .Marland Kitchen... 2 am, 1 pm  Complete Medication List: 1)  Mvi  2)  Wellbutrin Xl 150 Mg Xr24h-tab (Bupropion hcl) .... 2 am, 1 pm 3)  Adderall Xr 30 Mg Xr24h-cap (Amphetamine-dextroamphetamine) .Marland Kitchen.. 1 by mouth once daily. medically necessary for brand name only. 4)  Naproxen 500 Mg Tabs (Naproxen) .... One by mouth twice a day. take with food 5)  Bcp Per Gyn  6)  Hydrocodone-acetaminophen 5-325 Mg Tabs (Hydrocodone-acetaminophen) .... Per gyn for has  Patient Instructions: 1)  heating pad 2)  X-ray 3)  Naproxen as prescribed, watch for stomach irritation. 4)  Call if no better in 2 or 3  weeks Prescriptions: NAPROXEN 500 MG TABS (NAPROXEN) one by mouth twice a day. Take with food  #40 x 0   Entered and Authorized by:   Nolon Rod. Savreen Gebhardt MD   Signed by:   Nolon Rod. Zayaan Kozak MD on 01/05/2011   Method used:   Print then Give to Patient   RxID:   [id number]    Orders Added: 1)  T-Sacroiliac Joints [72202TC] 2)  Venipuncture [08657] 3)  TLB-TSH (Thyroid Stimulating Hormone) [84443-TSH] 4)  TLB-CBC Platelet - w/Differential [85025-CBCD] 5)  Est. Patient Level IV [84696]

## 2011-01-18 ENCOUNTER — Telehealth: Payer: Self-pay | Admitting: Internal Medicine

## 2011-01-20 NOTE — Progress Notes (Signed)
Summary: Adderall and mail order  Phone Note Refill Request Call back at Home Phone [phone] Message from:  Patient on January 10, 2011 4:55 PM  Refills Requested: Medication #1:  ADDERALL XR 30 MG XR24H-CAP 1 by mouth once daily. MEDICALLY NECESSARY FOR BRAND NAME ONLY.   Notes: *no rush per pt  Medication #2:  AMPHETAMINE-DEXTROAMPHETAMINE 10 MG TABS one by mouth in the afternoon as needed. Patient notes that she just picked up her 10mg  prescription and now has to change to mail order. She would like both prescriptions to be generic and sent to [facility]. Please advise.  Initial call taken by: Lucious Groves CMA,  January 10, 2011 4:56 PM  Follow-up for Phone Call        ok 90 days each, generic Connie Lewis E. Bijon Mineer MD  January 11, 2011 1:13 PM   Additional Follow-up for Phone Call Additional follow up Details #1::        rx's up front for pt. Army Fossa CMA  January 11, 2011 1:47 PM     New/Updated Medications: AMPHETAMINE-DEXTROAMPHETAMINE 30 MG XR24H-CAP (AMPHETAMINE-DEXTROAMPHETAMINE) 1 by mouth once daily. Prescriptions: AMPHETAMINE-DEXTROAMPHETAMINE 10 MG TABS (AMPHETAMINE-DEXTROAMPHETAMINE) one by mouth in the afternoon as needed  #90 x 0   Entered by:   Army Fossa CMA   Authorized by:   Nolon Rod. Anaelle Dunton MD   Signed by:   Army Fossa CMA on 01/11/2011   Method used:   Print then Give to Patient   RxID:   [id number] AMPHETAMINE-DEXTROAMPHETAMINE 30 MG XR24H-CAP (AMPHETAMINE-DEXTROAMPHETAMINE) 1 by mouth once daily.  #90 x 0   Entered by:   Army Fossa CMA   Authorized by:   Nolon Rod. Connie Radich MD   Signed by:   Army Fossa CMA on 01/11/2011   Method used:   Print then Give to Patient   RxID:   [id number]

## 2011-01-26 NOTE — Progress Notes (Signed)
Summary: Cough syrup request  Phone Note Call from Patient Call back at Home Phone [phone]   Summary of Call: Patient called noting that she has a cold/congestion with severe cough and would like prescription for cough syrup. Patient was advised that this usually requires an office visit. Please advise. Initial call taken by: Lucious Groves CMA,  January 18, 2011 12:02 PM  Follow-up for Phone Call        rec mucinex DM OTC if feeling poorly or no better in 2-3 days ----> needs OV (although I'll prescribe a strong syrup only if needed) Emil Weigold E. Marlaina Coburn MD  January 18, 2011 12:15 PM   Additional Follow-up for Phone Call Additional follow up Details #1::        Patient notified.  Additional Follow-up by: Lucious Groves CMA,  January 18, 2011 12:21 PM

## 2011-01-28 ENCOUNTER — Telehealth (INDEPENDENT_AMBULATORY_CARE_PROVIDER_SITE_OTHER): Payer: Self-pay | Admitting: *Deleted

## 2011-01-28 ENCOUNTER — Telehealth: Payer: Self-pay | Admitting: Internal Medicine

## 2011-02-01 NOTE — Progress Notes (Signed)
Summary: adderall  Phone Note Call from Patient Call back at Home Phone [phone]   Summary of Call: Pt called and states that [facility] had the wrong address for her meds-- she needs a 5 day supply because it will be another 7 days before she recieves meds. Please advise if okay to give 5 day of Adderall 30mg  XR. Initial call taken by: Army Fossa CMA,  January 28, 2011 2:28 PM  Follow-up for Phone Call        ok [place] E. Shai Rasmussen MD  January 28, 2011 2:40 PM     Prescriptions: AMPHETAMINE-DEXTROAMPHETAMINE 30 MG XR24H-CAP (AMPHETAMINE-DEXTROAMPHETAMINE) 1 by mouth once daily.  #5 x 0   Entered by:   Army Fossa CMA   Authorized by:   Nolon Rod. Dezaray Shibuya MD   Signed by:   Army Fossa CMA on 01/28/2011   Method used:   Printed then faxed to ...       [facility] [phone]* (retail)       [address]       [place], [place]  [address]       Ph: [phone]       Fax: [phone]   RxID:   [id number]  did not fax to pharmacy--- pt picked up rx. she is aware rx is ready. Army Fossa CMA  January 28, 2011 2:45 PM

## 2011-02-01 NOTE — Progress Notes (Signed)
Summary: Med Clarification  Phone Note From Pharmacy Call back at [phone]   Caller: [facility] Summary of Call: Message taken off triage voicemail: Please call to clarify rx for generic adderall 30mg  and 10mg  tabs. Ref # [id number]  Shonna Chock CMA  January 28, 2011 11:50 AM   Follow-up for Phone Call        I spoke w/ pharmacy and clarified that pt is on both 10 and 30 mg of generic adderall. Army Fossa CMA  January 28, 2011 11:55 AM

## 2011-02-03 ENCOUNTER — Encounter: Payer: Self-pay | Admitting: Internal Medicine

## 2011-02-15 LAB — CBC
HCT: 30.8 % — ABNORMAL LOW (ref 36.0–46.0)
HCT: 34 % — ABNORMAL LOW (ref 36.0–46.0)
Hemoglobin: 10.6 g/dL — ABNORMAL LOW (ref 12.0–15.0)
Hemoglobin: 11.6 g/dL — ABNORMAL LOW (ref 12.0–15.0)
MCH: 29 pg (ref 26.0–34.0)
MCH: 29.5 pg (ref 26.0–34.0)
MCHC: 34.1 g/dL (ref 30.0–36.0)
MCHC: 34.4 g/dL (ref 30.0–36.0)
MCV: 85.1 fL (ref 78.0–100.0)
MCV: 85.6 fL (ref 78.0–100.0)
Platelets: 221 10*3/uL (ref 150–400)
Platelets: 266 10*3/uL (ref 150–400)
RBC: 3.6 MIL/uL — ABNORMAL LOW (ref 3.87–5.11)
RBC: 3.99 MIL/uL (ref 3.87–5.11)
RDW: 12.9 % (ref 11.5–15.5)
RDW: 13 % (ref 11.5–15.5)
WBC: 13.2 10*3/uL — ABNORMAL HIGH (ref 4.0–10.5)
WBC: 17.5 10*3/uL — ABNORMAL HIGH (ref 4.0–10.5)

## 2011-02-15 LAB — COMPREHENSIVE METABOLIC PANEL
ALT: 13 U/L (ref 0–35)
AST: 20 U/L (ref 0–37)
Albumin: 3.1 g/dL — ABNORMAL LOW (ref 3.5–5.2)
Alkaline Phosphatase: 148 U/L — ABNORMAL HIGH (ref 39–117)
BUN: 6 mg/dL (ref 6–23)
CO2: 21 mEq/L (ref 19–32)
Calcium: 9.4 mg/dL (ref 8.4–10.5)
Chloride: 102 mEq/L (ref 96–112)
Creatinine, Ser: 0.59 mg/dL (ref 0.4–1.2)
GFR calc Af Amer: >60 mL/min (ref >60)
GFR calc non Af Amer: >60 mL/min (ref >60)
Glucose, Bld: 92 mg/dL (ref 70–99)
Potassium: 3.6 mEq/L (ref 3.5–5.1)
Sodium: 134 mEq/L — ABNORMAL LOW (ref 135–145)
Total Bilirubin: 0.1 mg/dL — ABNORMAL LOW (ref 0.3–1.2)
Total Protein: 6.2 g/dL (ref 6.0–8.3)

## 2011-02-15 LAB — RPR: RPR Ser Ql: NONREACTIVE

## 2011-02-15 LAB — ABO/RH: ABO/RH(D): O POS

## 2011-02-15 NOTE — Letter (Signed)
Summary: [facility]   Imported By: Maryln Gottron 02/08/2011 14:01:55  _____________________________________________________________________  External Attachment:    Type:   Image     Comment:   External Document

## 2011-02-17 LAB — COMPREHENSIVE METABOLIC PANEL
ALT: 13 U/L (ref 0–35)
ALT: 14 U/L (ref 0–35)
AST: 17 U/L (ref 0–37)
Albumin: 3.1 g/dL — ABNORMAL LOW (ref 3.5–5.2)
Alkaline Phosphatase: 95 U/L (ref 39–117)
Alkaline Phosphatase: 95 U/L (ref 39–117)
BUN: 4 mg/dL — ABNORMAL LOW (ref 6–23)
BUN: 4 mg/dL — ABNORMAL LOW (ref 6–23)
CO2: 25 mEq/L (ref 19–32)
CO2: 24 mEq/L (ref 19–32)
Calcium: 9.4 mg/dL (ref 8.4–10.5)
Chloride: 106 mEq/L (ref 96–112)
Chloride: 102 mEq/L (ref 96–112)
Creatinine, Ser: 0.53 mg/dL (ref 0.4–1.2)
GFR calc Af Amer: >60 mL/min (ref >60)
GFR calc non Af Amer: >60 mL/min (ref >60)
GFR calc non Af Amer: >60 mL/min (ref >60)
Glucose, Bld: 96 mg/dL (ref 70–99)
Glucose, Bld: 93 mg/dL (ref 70–99)
Potassium: 3.5 mEq/L (ref 3.5–5.1)
Potassium: 3.9 mEq/L (ref 3.5–5.1)
Sodium: 138 mEq/L (ref 135–145)
Sodium: 134 mEq/L — ABNORMAL LOW (ref 135–145)
Total Bilirubin: 0.4 mg/dL (ref 0.3–1.2)
Total Bilirubin: 0.2 mg/dL — ABNORMAL LOW (ref 0.3–1.2)
Total Protein: 6 g/dL (ref 6.0–8.3)

## 2011-02-17 LAB — CBC
HCT: 34.7 % — ABNORMAL LOW (ref 36.0–46.0)
HCT: 33.9 % — ABNORMAL LOW (ref 36.0–46.0)
Hemoglobin: 11.9 g/dL — ABNORMAL LOW (ref 12.0–15.0)
Hemoglobin: 11.7 g/dL — ABNORMAL LOW (ref 12.0–15.0)
MCH: 29.8 pg (ref 26.0–34.0)
MCHC: 34.2 g/dL (ref 30.0–36.0)
MCV: 87.1 fL (ref 78.0–100.0)
MCV: 86.8 fL (ref 78.0–100.0)
Platelets: 213 10*3/uL (ref 150–400)
RBC: 3.99 MIL/uL (ref 3.87–5.11)
RBC: 3.9 MIL/uL (ref 3.87–5.11)
RDW: 12.3 % (ref 11.5–15.5)
RDW: 12.3 % (ref 11.5–15.5)
WBC: 13.4 10*3/uL — ABNORMAL HIGH (ref 4.0–10.5)
WBC: 14.8 10*3/uL — ABNORMAL HIGH (ref 4.0–10.5)

## 2011-02-17 LAB — LACTATE DEHYDROGENASE: LDH: 145 U/L (ref 94–250)

## 2011-02-17 LAB — URINALYSIS, ROUTINE W REFLEX MICROSCOPIC
Bilirubin Urine: NEGATIVE
Glucose, UA: NEGATIVE mg/dL
Glucose, UA: NEGATIVE mg/dL
Ketones, ur: NEGATIVE mg/dL
Leukocytes, UA: NEGATIVE
Nitrite: NEGATIVE
Protein, ur: NEGATIVE mg/dL
Protein, ur: NEGATIVE mg/dL
pH: 6 (ref 5.0–8.0)

## 2011-02-17 LAB — URINE MICROSCOPIC-ADD ON

## 2011-02-17 LAB — URIC ACID: Uric Acid, Serum: 4.1 mg/dL (ref 2.4–7.0)

## 2011-02-18 LAB — URINALYSIS, ROUTINE W REFLEX MICROSCOPIC
Bilirubin Urine: NEGATIVE
Hgb urine dipstick: NEGATIVE
Ketones, ur: NEGATIVE mg/dL
Nitrite: NEGATIVE
Urobilinogen, UA: 0.2 mg/dL (ref 0.0–1.0)

## 2011-02-18 LAB — URINE MICROSCOPIC-ADD ON

## 2011-03-17 ENCOUNTER — Telehealth: Payer: Self-pay | Admitting: *Deleted

## 2011-03-17 MED ORDER — ONDANSETRON HCL 4 MG PO TABS: 4.0000 mg | ORAL_TABLET | Freq: Three times a day (TID) | ORAL | Status: AC | PRN

## 2011-03-17 NOTE — Telephone Encounter (Signed)
Spoke w/ pt aware of information.  

## 2011-03-17 NOTE — Telephone Encounter (Signed)
Phenergan interacts with Wellbutrin, will prescribe Zofran, see prescription. OV or ER if symptoms of severe or not better in 2 days

## 2011-04-06 ENCOUNTER — Other Ambulatory Visit: Payer: Self-pay | Admitting: *Deleted

## 2011-04-06 MED ORDER — AMPHETAMINE-DEXTROAMPHETAMINE 10 MG PO TABS: ORAL_TABLET | ORAL | Status: DC

## 2011-04-06 MED ORDER — AMPHETAMINE-DEXTROAMPHET ER 30 MG PO CP24: 30.0000 mg | ORAL_CAPSULE | ORAL | Status: DC

## 2011-04-06 NOTE — Telephone Encounter (Signed)
Reprinted rx

## 2011-04-06 NOTE — Telephone Encounter (Signed)
Addended by: Doristine Devoid on: 04/06/2011 04:57 PM   Modules accepted: Orders

## 2011-04-20 ENCOUNTER — Other Ambulatory Visit: Payer: Self-pay

## 2011-04-20 MED ORDER — AMPHETAMINE-DEXTROAMPHET ER 30 MG PO CP24: 30.0000 mg | ORAL_CAPSULE | ORAL | Status: DC

## 2011-04-20 NOTE — Telephone Encounter (Signed)
Pt called says mailorder messed up on prescribtion by sending to wrong address so it will be another 14 days before she gets it just needs a 10 day supply.

## 2011-05-11 ENCOUNTER — Ambulatory Visit (INDEPENDENT_AMBULATORY_CARE_PROVIDER_SITE_OTHER): Payer: 59 | Admitting: Internal Medicine

## 2011-05-11 ENCOUNTER — Encounter: Payer: Self-pay | Admitting: Internal Medicine

## 2011-05-11 DIAGNOSIS — IMO0002 Reserved for concepts with insufficient information to code with codable children: Secondary | ICD-10-CM | POA: Insufficient documentation

## 2011-05-11 DIAGNOSIS — T7491XA Unspecified adult maltreatment, confirmed, initial encounter: Secondary | ICD-10-CM

## 2011-05-11 NOTE — Assessment & Plan Note (Signed)
See assessment and plan
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
## 2011-06-01 ENCOUNTER — Telehealth: Payer: Self-pay | Admitting: *Deleted

## 2011-06-01 NOTE — Telephone Encounter (Signed)
Pt called states she is having lower back pain, she has had UTI symptoms. I offered pt an appt here. She states she would like to go somewhere where they could do Xrays, or any screenings all at one place. Advised pt she could go to the [facility] [address]. Pt agreed to go.

## 2011-06-01 NOTE — Telephone Encounter (Signed)
Noted  

## 2011-06-20 ENCOUNTER — Telehealth: Payer: Self-pay | Admitting: *Deleted

## 2011-06-20 MED ORDER — AMPHETAMINE-DEXTROAMPHET ER 30 MG PO CP24: 30.0000 mg | ORAL_CAPSULE | ORAL | Status: DC

## 2011-06-20 MED ORDER — AMPHETAMINE-DEXTROAMPHETAMINE 10 MG PO TABS: ORAL_TABLET | ORAL | Status: DC

## 2011-06-20 NOTE — Telephone Encounter (Signed)
I'll RF as needed

## 2011-06-20 NOTE — Telephone Encounter (Signed)
Spoke w/ pt rf meds for her.

## 2011-06-20 NOTE — Telephone Encounter (Signed)
Per Duwayne Heck MD requires follow up every 6 months for Adderall. Left message on machine to call and sch appt.

## 2011-06-20 NOTE — Telephone Encounter (Signed)
Pt did not leave name of the med she was calling about nor her question. Left message on voicemail to call the office

## 2011-06-20 NOTE — Telephone Encounter (Signed)
Addended by: Leanne Lovely on: 06/20/2011 04:35 PM   Modules accepted: Orders
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
## 2011-06-30 ENCOUNTER — Telehealth: Payer: Self-pay | Admitting: Internal Medicine

## 2011-06-30 NOTE — Telephone Encounter (Signed)
Pt called says that mail order rx hasn't arrive as of yet and would like to know if she could have 10 day prescription. Until it arrives only needs Adderall 30 mg XR

## 2011-07-01 MED ORDER — AMPHETAMINE-DEXTROAMPHET ER 30 MG PO CP24: 30.0000 mg | ORAL_CAPSULE | ORAL | Status: DC

## 2011-07-01 NOTE — Telephone Encounter (Signed)
Ok to call it in , actually call #30 since she will get the same # of pills for her copayment

## 2011-07-01 NOTE — Telephone Encounter (Signed)
Pt is aware.  

## 2011-07-18 ENCOUNTER — Telehealth: Payer: Self-pay | Admitting: *Deleted

## 2011-07-18 ENCOUNTER — Telehealth: Payer: Self-pay | Admitting: Internal Medicine

## 2011-07-18 MED ORDER — AMPHETAMINE-DEXTROAMPHETAMINE 10 MG PO TABS: ORAL_TABLET | ORAL | Status: DC

## 2011-07-18 NOTE — Telephone Encounter (Signed)
Pt called says that mail order company didn't fill Adderall 10 mg says they told her they didn't receive rx not sure why since they had filled Adderall 30 mg so pt says she will another rx written. Pt provided pharmacy # to [facility].  I called [facility] to verify was informed that Adderall 10 mg was cancelled by them they said rx wasn't sign so pt will need to have new rx printed.   Pt aware rx ready for pick up

## 2011-07-18 NOTE — Telephone Encounter (Signed)
LMOM to inform Pt that Rx is ready for p/u M-F 8a-5p

## 2011-08-30 ENCOUNTER — Other Ambulatory Visit: Payer: Self-pay | Admitting: Internal Medicine

## 2011-08-30 NOTE — Telephone Encounter (Signed)
ADDERALL XR 30 MG 1 po qd , #90, no RF

## 2011-08-30 NOTE — Telephone Encounter (Signed)
Refill adderall 30 mg xr - 90 day supply - patient sends rx to mail order pharmacy & it usually rakes a month to get it   -  She said dr Drue Novel is aware of this--patient will pick up Thursday [id number] ---

## 2011-08-31 MED ORDER — AMPHETAMINE-DEXTROAMPHET ER 30 MG PO CP24: 30.0000 mg | ORAL_CAPSULE | ORAL | Status: DC

## 2011-08-31 NOTE — Telephone Encounter (Signed)
Done

## 2011-09-08 ENCOUNTER — Telehealth: Payer: Self-pay | Admitting: *Deleted

## 2011-09-08 NOTE — Telephone Encounter (Signed)
Pt left VM that she sent Rx in to mail order but due to her recent move med was sent to wrong pharmacy. Pt is now requesting a 10 day supply so that she can take it to local pharmacy until mail order comes in. adderall XR 30 mg  Last filled 08-30-29 #90.Please advise

## 2011-09-09 MED ORDER — AMPHETAMINE-DEXTROAMPHET ER 30 MG PO CP24: 30.0000 mg | ORAL_CAPSULE | ORAL | Status: DC

## 2011-09-09 NOTE — Telephone Encounter (Signed)
Yes, ok to send 10 day supply

## 2011-09-09 NOTE — Telephone Encounter (Signed)
Left message to call office

## 2011-09-09 NOTE — Telephone Encounter (Signed)
Pt aware Rx ready for pick up 

## 2011-09-15 ENCOUNTER — Encounter: Payer: Self-pay | Admitting: Internal Medicine

## 2011-09-15 ENCOUNTER — Ambulatory Visit: Payer: 59 | Admitting: Internal Medicine

## 2011-09-15 ENCOUNTER — Ambulatory Visit (INDEPENDENT_AMBULATORY_CARE_PROVIDER_SITE_OTHER): Payer: 59 | Admitting: Internal Medicine

## 2011-09-15 DIAGNOSIS — R51 Headache: Secondary | ICD-10-CM

## 2011-09-15 DIAGNOSIS — F909 Attention-deficit hyperactivity disorder, unspecified type: Secondary | ICD-10-CM

## 2011-09-15 DIAGNOSIS — M549 Dorsalgia, unspecified: Secondary | ICD-10-CM

## 2011-09-15 DIAGNOSIS — J309 Allergic rhinitis, unspecified: Secondary | ICD-10-CM

## 2011-09-15 DIAGNOSIS — F329 Major depressive disorder, single episode, unspecified: Secondary | ICD-10-CM

## 2011-09-15 DIAGNOSIS — T7491XA Unspecified adult maltreatment, confirmed, initial encounter: Secondary | ICD-10-CM

## 2011-09-15 DIAGNOSIS — IMO0002 Reserved for concepts with insufficient information to code with codable children: Secondary | ICD-10-CM

## 2011-09-15 MED ORDER — AMPHETAMINE-DEXTROAMPHET ER 20 MG PO CP24: 20.0000 mg | ORAL_CAPSULE | Freq: Two times a day (BID) | ORAL | Status: DC

## 2011-09-15 NOTE — Assessment & Plan Note (Signed)
Better with increase of topamax from 50 to 100 but hands are cold : rec to d/w [facility]

## 2011-09-15 NOTE — Assessment & Plan Note (Signed)
Now under the care of ortho, they are Rx hydrocodone

## 2011-09-15 NOTE — Assessment & Plan Note (Signed)
Well controlled at present
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
## 2011-09-15 NOTE — Assessment & Plan Note (Signed)
Samples of Qnsal, try zyrtec
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
## 2011-09-15 NOTE — Patient Instructions (Addendum)
Try  adderall xr 20 twice a day, second dose no latter than 2 pm; see how that works  Do not mix adderall 30 and 10 mg with the 20mg s ! Qnsal 2 sprays on each side until samples gone, call for refill if needed If claritin not helping, try zyrtec instead

## 2011-09-15 NOTE — Assessment & Plan Note (Signed)
Sx not as well controlled as before with adderall XR 30 and adderall 10 at pm; feeling spacey and "down" after the 30mg  wears off. We discussed vyvanse which could be a very good option but cost is an issues; we agreed to to adderall xr 20 bid, second dose 2 pm . See instructions

## 2011-09-23 ENCOUNTER — Telehealth: Payer: Self-pay | Admitting: *Deleted
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
## 2011-09-23 NOTE — Telephone Encounter (Signed)
We can send her records to whoever she elects to

## 2011-09-27 ENCOUNTER — Telehealth: Payer: Self-pay

## 2011-09-27 NOTE — Telephone Encounter (Signed)
See other phone message  

## 2011-09-27 NOTE — Telephone Encounter (Signed)
Pt return call, left message to call office.

## 2011-09-27 NOTE — Telephone Encounter (Signed)
Spoke with patient, patient would like a letter that overviews her visit. Patient would like the picture that was included in her visit to be attached to her letter. Patient has court tomorrow and would like to pick this letter up today if possible

## 2011-09-27 NOTE — Telephone Encounter (Signed)
Left message to call office

## 2011-09-27 NOTE — Telephone Encounter (Signed)
Everything I can say about the situation is in the office visit note. Provide a copy to the patient.

## 2011-09-28 NOTE — Telephone Encounter (Signed)
Left message on voicemail informing patient of Dr.Paz's response and patient to schedule an appointment if she needs to further discuss

## 2011-10-13 ENCOUNTER — Other Ambulatory Visit: Payer: Self-pay

## 2011-10-13 NOTE — Telephone Encounter (Signed)
60, no RF 

## 2011-10-14 MED ORDER — AMPHETAMINE-DEXTROAMPHET ER 20 MG PO CP24: 20.0000 mg | ORAL_CAPSULE | Freq: Two times a day (BID) | ORAL | Status: DC

## 2011-10-14 NOTE — Telephone Encounter (Signed)
Left message to notify pt Rx ready to pick up 

## 2011-11-03 ENCOUNTER — Ambulatory Visit (INDEPENDENT_AMBULATORY_CARE_PROVIDER_SITE_OTHER): Payer: 59 | Admitting: Internal Medicine

## 2011-11-03 ENCOUNTER — Encounter: Payer: Self-pay | Admitting: Internal Medicine

## 2011-11-03 VITALS — BP 100/80 | HR 121 | Temp 97.5°F | Wt 128.6 lb

## 2011-11-03 DIAGNOSIS — H1045 Other chronic allergic conjunctivitis: Secondary | ICD-10-CM

## 2011-11-03 DIAGNOSIS — F909 Attention-deficit hyperactivity disorder, unspecified type: Secondary | ICD-10-CM

## 2011-11-03 DIAGNOSIS — H1010 Acute atopic conjunctivitis, unspecified eye: Secondary | ICD-10-CM

## 2011-11-03 MED ORDER — AMPHETAMINE-DEXTROAMPHETAMINE 10 MG PO TABS: ORAL_TABLET | ORAL | Status: DC

## 2011-11-03 MED ORDER — AMPHETAMINE-DEXTROAMPHET ER 30 MG PO CP24: 30.0000 mg | ORAL_CAPSULE | ORAL | Status: DC

## 2011-11-03 MED ORDER — NEDOCROMIL SODIUM 2 % OP SOLN: 1.0000 [drp] | Freq: Two times a day (BID) | OPHTHALMIC | Status: DC | PRN

## 2011-11-03 NOTE — Assessment & Plan Note (Signed)
Symptoms likely due to allergic conjunctivitis, we'll try Alocril, see instructions

## 2011-11-03 NOTE — Patient Instructions (Signed)
Stop the OTC eye drops and use the one I'm prescribing  See your eye doctor or call for a referral if not better

## 2011-11-03 NOTE — Assessment & Plan Note (Signed)
We recently switched her regimen to Adderall XR 20 mg twice a day, patient is not satisfied with the results. She like to go back to Adderall XR 30 in the morning and plain adderall 10 mg in the afternoon. Plan: Go back to previous regimen Refer to psychiatry for further adjustment

## 2011-11-03 NOTE — Progress Notes (Signed)
  Subjective:    Patient ID: Connie Lewis, female    DOB: 05/10/1986, 25 y.o.   MRN: [id number]  HPI Acute visit, initially to be seen for a UTI but symptoms  resolved. Nevertheless, she complains of redness in both eyes at the end of the day, she has been using over-the-counter allergy drops  without much help. Also, we recently changed her ADHD medication to Adderall XR 20 mg twice a day. This approach is not working  as well as the previous regimen and she is now unable to sleep sometimes  Past Medical History  Diagnosis Date  . Migraine     dx during pregnancy  . ADHD (attention deficit hyperactivity disorder)   . Allergic rhinitis   . Asthma   . GERD (gastroesophageal reflux disease)   . Depression     dx during her pregnancy in 2000   No past surgical history on file.  Review of Systems Her vision is normal, eyes feel itchy most of the time and dry. Does not use contacts Denies depression but she does have some anxiety.    Objective:   Physical Exam  Constitutional: She is oriented to person, place, and time. She appears well-developed and well-nourished.  HENT:  Head: Normocephalic and atraumatic.  Eyes: Conjunctivae and EOM are normal. Pupils are equal, round, and reactive to light.       Anterior chambers normal bilaterally  Neurological: She is alert and oriented to person, place, and time.  Psychiatric: She has a normal mood and affect. Her behavior is normal. Judgment and thought content normal.      Assessment & Plan:

## 2011-11-04 ENCOUNTER — Telehealth: Payer: Self-pay

## 2011-11-04 NOTE — Telephone Encounter (Signed)
Call from patient and she stated the eye drops that was prescribed for her yesterday are 70 dollars and she would like [facility] [address]. Please advise    KP

## 2011-11-07 ENCOUNTER — Telehealth: Payer: Self-pay | Admitting: Internal Medicine

## 2011-11-07 MED ORDER — CROMOLYN SODIUM 4 % OP SOLN: 1.0000 [drp] | Freq: Four times a day (QID) | OPHTHALMIC | Status: DC

## 2011-11-07 NOTE — Telephone Encounter (Signed)
PATIENT DENYING PSYCH REFERRAL AT THIS TIME DUE TO FINANCIAL REASONS, BUT ALSO DENYING SINCE DR. PAZ HAS ADJUSTED HER ADDERALL.  PATIENT STATES SINCE THE ADJUSTMENT, SHE IS SLEEPING 7-8 HOURS PER NIGHT.  WANTS TO WAIT & SEE HOW SHE DOES ON ADDERALL FOR A WHILE.  ALSO, PATIENT STATES ON THE SHEET WAS GIVEN TO TAKE HOME AFTER HER LAST OFFICE VISIT, THAT THE DOSAGE ON THE HYDROCODONE IS WRONG.  PLEASE CALL PATIENT.

## 2011-11-07 NOTE — Telephone Encounter (Signed)
Advise patient, I sent a new  prescription hopefully it will be less expensive

## 2011-11-07 NOTE — Telephone Encounter (Signed)
Patient aware and voiced understanding.      KP 

## 2011-11-08 NOTE — Telephone Encounter (Signed)
Noted. Please correct her med list

## 2011-11-09 NOTE — Telephone Encounter (Signed)
Pt med list is correct and she is aware.  She also wants to know if Dr. Drue Novel will continue to see her instead of referring her to psych because the change in adderall dosage has helped her. Please advise

## 2011-11-09 NOTE — Telephone Encounter (Signed)
Ok , I'll f/u her ADHD, needs OV 3 months

## 2011-11-10 NOTE — Telephone Encounter (Signed)
Left message to notify pt to call and schedule 3 month f/u

## 2011-12-01 ENCOUNTER — Other Ambulatory Visit: Payer: Self-pay

## 2011-12-01 MED ORDER — AMPHETAMINE-DEXTROAMPHETAMINE 10 MG PO TABS: ORAL_TABLET | ORAL | Status: DC

## 2011-12-01 MED ORDER — AMPHETAMINE-DEXTROAMPHET ER 30 MG PO CP24: 30.0000 mg | ORAL_CAPSULE | ORAL | Status: DC

## 2011-12-01 NOTE — Telephone Encounter (Signed)
Patient called triage line requesting refill for adderall 30 mg xr and 10 mg , patient was informed she needs to schedule a 3 month f/u (March 2013), patient will schedule when she comes to pick up rx's   Dr.Paz please advise

## 2011-12-01 NOTE — Telephone Encounter (Signed)
Rx printed and patient made aware to pick up tomorrow   KP

## 2012-01-02 ENCOUNTER — Telehealth: Payer: Self-pay | Admitting: *Deleted

## 2012-01-02 NOTE — Telephone Encounter (Signed)
Ok  ADDERALL XR, 30MG  1 po qd prn , #30, no RF   ADDERALL, 10MG   1 tab a day at around 2 pm as needed, #30, no RF

## 2012-01-02 NOTE — Telephone Encounter (Signed)
Refill request for Adderall 10mg  & 30mg . #30 with zero refills. OK to refill?

## 2012-01-03 ENCOUNTER — Other Ambulatory Visit: Payer: Self-pay | Admitting: Internal Medicine

## 2012-01-03 MED ORDER — AMPHETAMINE-DEXTROAMPHETAMINE 10 MG PO TABS: ORAL_TABLET | ORAL | Status: DC

## 2012-01-03 MED ORDER — AMPHETAMINE-DEXTROAMPHET ER 30 MG PO CP24: 30.0000 mg | ORAL_CAPSULE | ORAL | Status: DC

## 2012-01-03 NOTE — Telephone Encounter (Signed)
Refill Done.

## 2012-02-03 ENCOUNTER — Ambulatory Visit (INDEPENDENT_AMBULATORY_CARE_PROVIDER_SITE_OTHER): Payer: 59 | Admitting: Internal Medicine

## 2012-02-03 VITALS — BP 120/70 | HR 90 | Temp 97.9°F | Wt 133.0 lb

## 2012-02-03 DIAGNOSIS — G43909 Migraine, unspecified, not intractable, without status migrainosus: Secondary | ICD-10-CM | POA: Insufficient documentation

## 2012-02-03 DIAGNOSIS — M549 Dorsalgia, unspecified: Secondary | ICD-10-CM

## 2012-02-03 DIAGNOSIS — J309 Allergic rhinitis, unspecified: Secondary | ICD-10-CM

## 2012-02-03 DIAGNOSIS — L708 Other acne: Secondary | ICD-10-CM

## 2012-02-03 DIAGNOSIS — F909 Attention-deficit hyperactivity disorder, unspecified type: Secondary | ICD-10-CM

## 2012-02-03 MED ORDER — AMPHETAMINE-DEXTROAMPHETAMINE 10 MG PO TABS: ORAL_TABLET | ORAL | Status: DC

## 2012-02-03 MED ORDER — FLUTICASONE PROPIONATE 50 MCG/ACT NA SUSP: 2.0000 | Freq: Every day | NASAL | Status: DC

## 2012-02-03 MED ORDER — OLOPATADINE HCL 0.1 % OP SOLN: 1.0000 [drp] | Freq: Two times a day (BID) | OPHTHALMIC | Status: AC

## 2012-02-03 MED ORDER — CLINDAMYCIN PHOSPHATE 1 % EX GEL: Freq: Two times a day (BID) | CUTANEOUS | Status: DC

## 2012-02-03 MED ORDER — AMPHETAMINE-DEXTROAMPHET ER 30 MG PO CP24: 30.0000 mg | ORAL_CAPSULE | ORAL | Status: DC

## 2012-02-03 NOTE — Assessment & Plan Note (Signed)
Chronic nasal congestion: start flonase

## 2012-02-03 NOTE — Assessment & Plan Note (Signed)
Pain mamangment per Dr Ethelene Hal

## 2012-02-03 NOTE — Assessment & Plan Note (Signed)
On topamax and xanax (HA triggered by anxiety) F/u by neuro

## 2012-02-03 NOTE — Assessment & Plan Note (Addendum)
Start treatment with clindagel

## 2012-02-03 NOTE — Progress Notes (Signed)
  Subjective:    Patient ID: Connie Lewis, female    DOB: 08-20-1986, 26 y.o.   MRN: [id number]  HPI  Routine office visit, has several issues ADHD, well-controlled with current medications, and needs a refill. Complaint of eye irritation, R>L for several months, currently taking mast cell stabilizer without much success .the conjunctiva is slightly red and occasionally she has discharge in the corner of the eye. Acne is getting slightly worse, she had some in the face and now is also at the upper chest and back.  Also complained of nose congestion for the last 2 months, puffy under her eyes.  Past Medical History  Diagnosis Date  . Migraine     dx during pregnancy  . ADHD (attention deficit hyperactivity disorder)   . Allergic rhinitis   . Asthma   . GERD (gastroesophageal reflux disease)   . Depression     dx during her pregnancy in 2000   No past surgical history on file.   Review of Systems  admits to occasional itchy and watery eyes. Vision is normal. No diplopia. As far as his sinus symptoms, she denies any fever or chills, occasional cough. Sometimes she sneezes. We reviewed her medication list, she's taken several things that are not compatible with pregnancy, she is not birth control pills. I strongly reminded that if she ever suspects is pregnant she is to stop all medications and talk with her gynecologist. Med list reviewed, she is taking several meds, many of them managed by other MDs    Objective:   Physical Exam  Alert oriented, no apparent distress Skin: covered with makeup in the face, on the upper chest and back she has several white dots w/ mild erythema consistent with acne. Eyes:EOMI, PERRLA, conjunctiva is slightly red, no discharge. Anterior chambers normal. No slightly congested without discharge Psych: Seems to be in good spirits, no evidence of anxiety or depression      Assessment & Plan:

## 2012-02-03 NOTE — Assessment & Plan Note (Signed)
Well controlled, RF 

## 2012-02-03 NOTE — Patient Instructions (Signed)
Add patanol.  See your eye doctor if no better in 2 weeks

## 2012-02-05 ENCOUNTER — Encounter: Payer: Self-pay | Admitting: Internal Medicine

## 2012-02-20 ENCOUNTER — Other Ambulatory Visit: Payer: Self-pay

## 2012-02-20 MED ORDER — CROMOLYN SODIUM 4 % OP SOLN: 1.0000 [drp] | Freq: Four times a day (QID) | OPHTHALMIC | Status: DC

## 2012-02-20 NOTE — Telephone Encounter (Signed)
Call from patient and she stated she was prescribed an eye drop earlier this month for allergies and and wanted to ry something different that was less expensive.

## 2012-03-05 ENCOUNTER — Telehealth: Payer: Self-pay | Admitting: *Deleted

## 2012-03-05 ENCOUNTER — Other Ambulatory Visit: Payer: Self-pay | Admitting: *Deleted

## 2012-03-05 MED ORDER — AMPHETAMINE-DEXTROAMPHET ER 30 MG PO CP24: 30.0000 mg | ORAL_CAPSULE | ORAL | Status: DC

## 2012-03-05 MED ORDER — AMPHETAMINE-DEXTROAMPHETAMINE 10 MG PO TABS: ORAL_TABLET | ORAL | Status: DC

## 2012-03-05 NOTE — Telephone Encounter (Signed)
Refill done.  

## 2012-03-05 NOTE — Telephone Encounter (Signed)
Pt aware Rx ready for pick up 

## 2012-04-02 ENCOUNTER — Other Ambulatory Visit: Payer: Self-pay | Admitting: *Deleted

## 2012-04-02 MED ORDER — AMPHETAMINE-DEXTROAMPHETAMINE 10 MG PO TABS: ORAL_TABLET | ORAL | Status: DC

## 2012-04-02 MED ORDER — AMPHETAMINE-DEXTROAMPHET ER 30 MG PO CP24: 30.0000 mg | ORAL_CAPSULE | ORAL | Status: DC

## 2012-04-02 NOTE — Telephone Encounter (Signed)
Pt aware Rx ready for pick up 

## 2012-04-05 ENCOUNTER — Telehealth: Payer: Self-pay | Admitting: Internal Medicine

## 2012-04-06 NOTE — Telephone Encounter (Signed)
Error

## 2012-05-01 ENCOUNTER — Other Ambulatory Visit: Payer: Self-pay | Admitting: *Deleted

## 2012-05-01 MED ORDER — AMPHETAMINE-DEXTROAMPHETAMINE 10 MG PO TABS: ORAL_TABLET | ORAL | Status: DC

## 2012-05-01 MED ORDER — AMPHETAMINE-DEXTROAMPHET ER 30 MG PO CP24: 30.0000 mg | ORAL_CAPSULE | ORAL | Status: DC

## 2012-05-01 NOTE — Telephone Encounter (Signed)
Left Pt detail message prescription ready for pick up

## 2012-05-31 ENCOUNTER — Other Ambulatory Visit: Payer: Self-pay | Admitting: *Deleted

## 2012-05-31 MED ORDER — AMPHETAMINE-DEXTROAMPHETAMINE 10 MG PO TABS: ORAL_TABLET | ORAL | Status: DC

## 2012-05-31 MED ORDER — AMPHETAMINE-DEXTROAMPHET ER 30 MG PO CP24: 30.0000 mg | ORAL_CAPSULE | ORAL | Status: DC

## 2012-05-31 NOTE — Telephone Encounter (Signed)
Pt aware Rx ready for pick up 

## 2012-06-27 ENCOUNTER — Other Ambulatory Visit: Payer: Self-pay | Admitting: *Deleted

## 2012-06-27 MED ORDER — AMPHETAMINE-DEXTROAMPHETAMINE 10 MG PO TABS: ORAL_TABLET | ORAL | Status: DC

## 2012-06-27 MED ORDER — AMPHETAMINE-DEXTROAMPHET ER 30 MG PO CP24: 30.0000 mg | ORAL_CAPSULE | ORAL | Status: DC

## 2012-06-27 NOTE — Telephone Encounter (Signed)
Rx ready for pick up in am

## 2012-07-26 ENCOUNTER — Other Ambulatory Visit: Payer: Self-pay | Admitting: *Deleted

## 2012-07-26 MED ORDER — AMPHETAMINE-DEXTROAMPHETAMINE 10 MG PO TABS: ORAL_TABLET | ORAL | Status: DC

## 2012-07-26 MED ORDER — AMPHETAMINE-DEXTROAMPHET ER 30 MG PO CP24: 30.0000 mg | ORAL_CAPSULE | ORAL | Status: DC

## 2012-07-26 NOTE — Telephone Encounter (Signed)
Left Pt detail message Rx will be ready for pick up on tomorrow.

## 2012-08-17 IMAGING — US US OB COMP +14 WK
2 series · 14 of 28 positions shown · non-contrast
Comparison: none

OBSTETRICAL ULTRASOUND:
 This ultrasound exam was performed in the [HOSPITAL] Ultrasound Department.  The OB US report was generated in the AS system, and faxed to the ordering physician.  This report is also available in [HOSPITAL]?s [employer] and in [REDACTED] PACS.

[Series 1: us ob comp +14 wk · 80 acquisitions, 13 frames shown (1 of 2)]
[im 4/80]
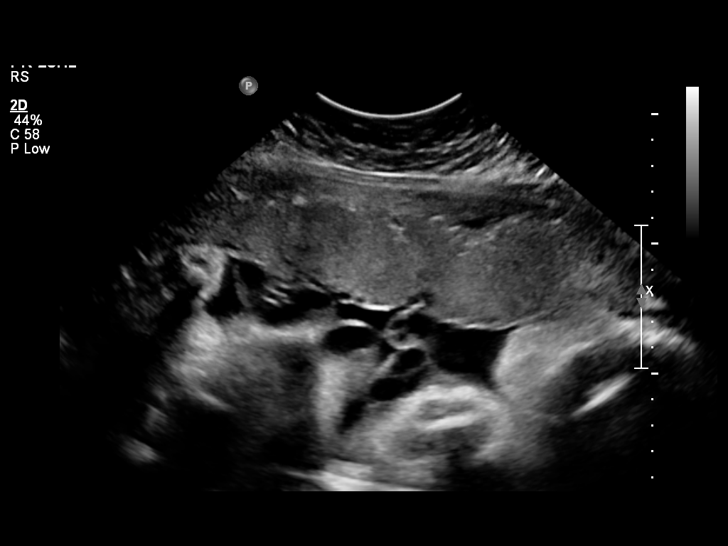
[im 10/80]
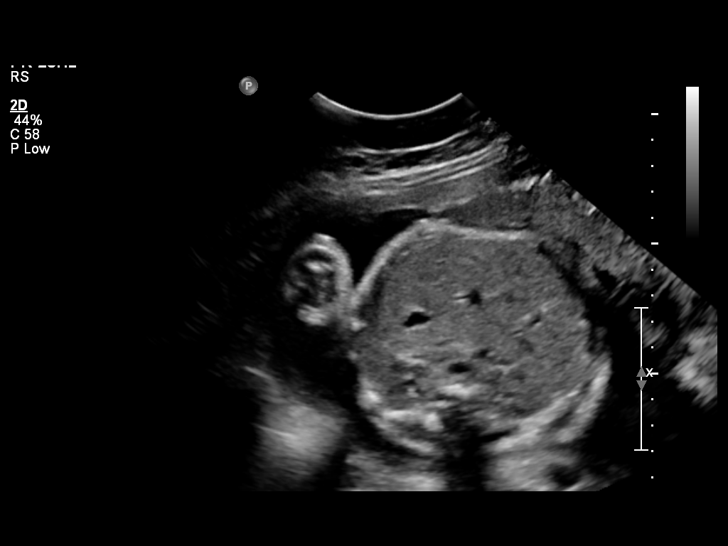
[im 16/80]
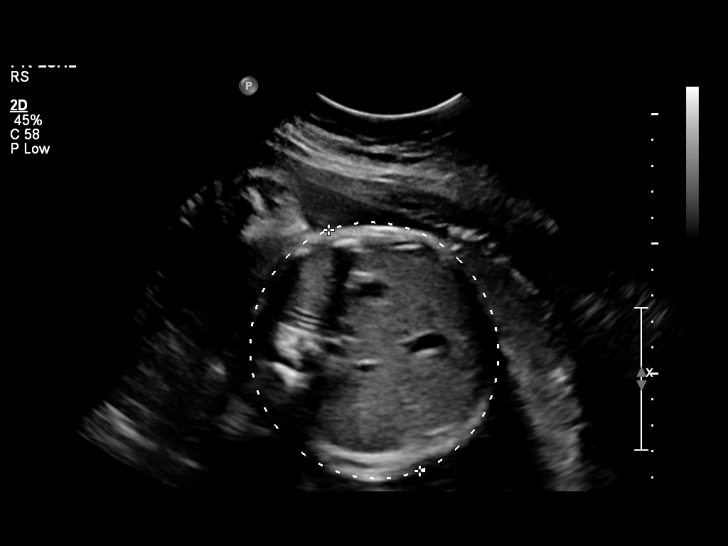
[im 23/80]
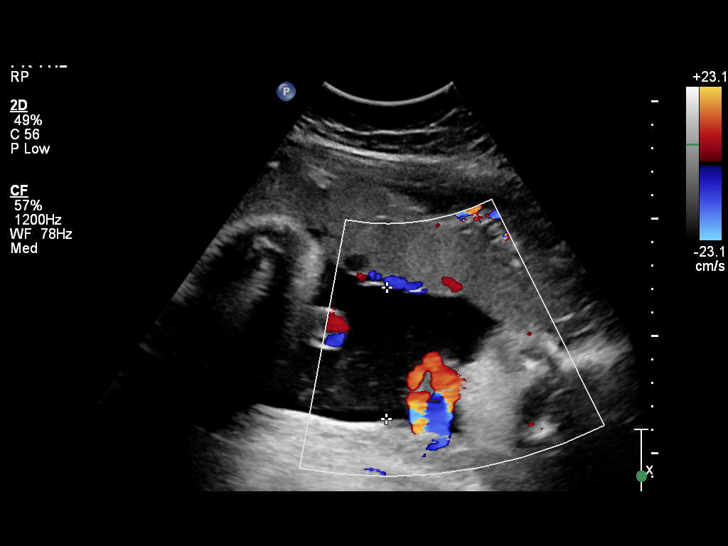
[im 29/80]
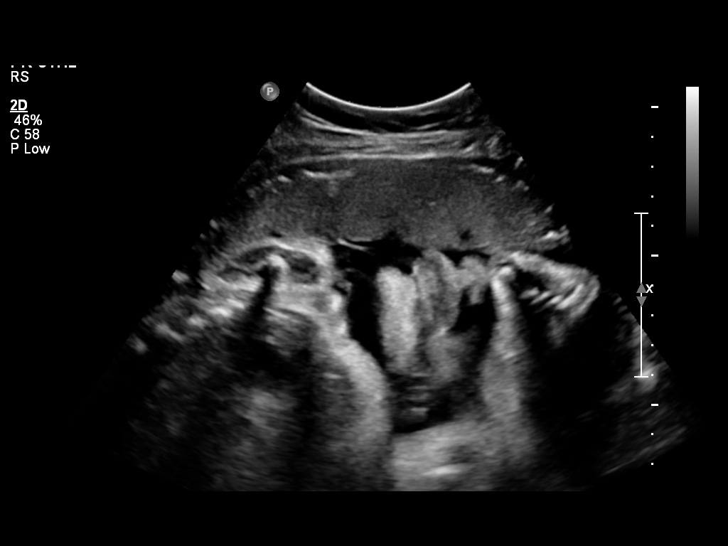
[im 35/80]
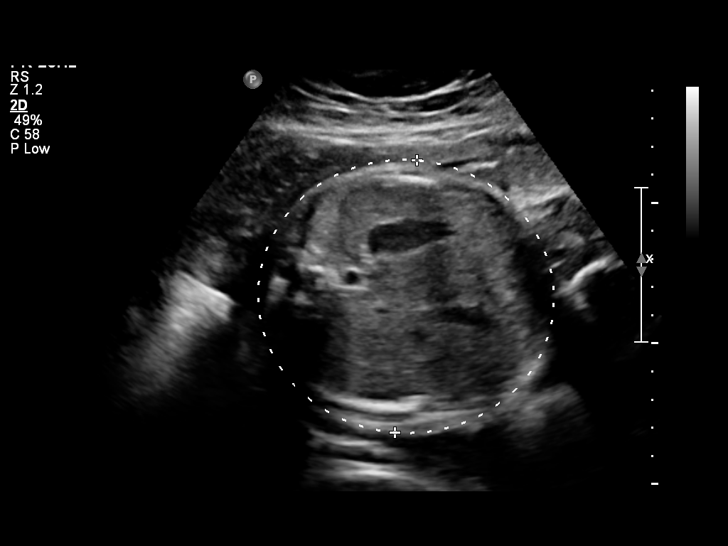
[im 42/80]
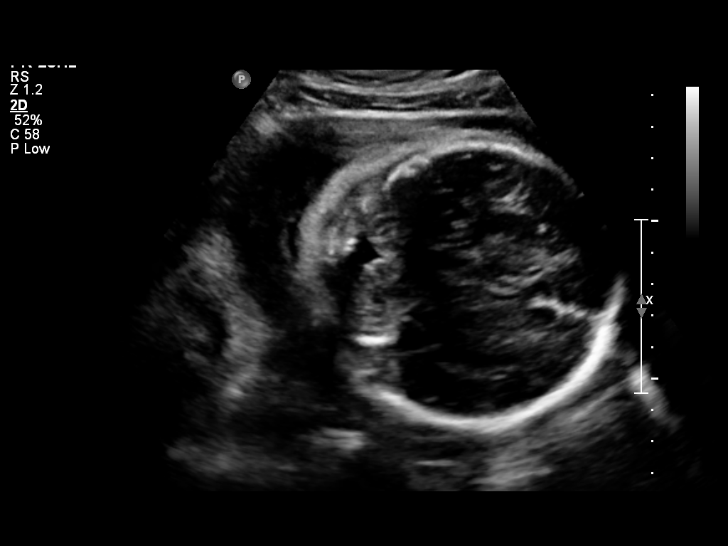
[im 48/80]
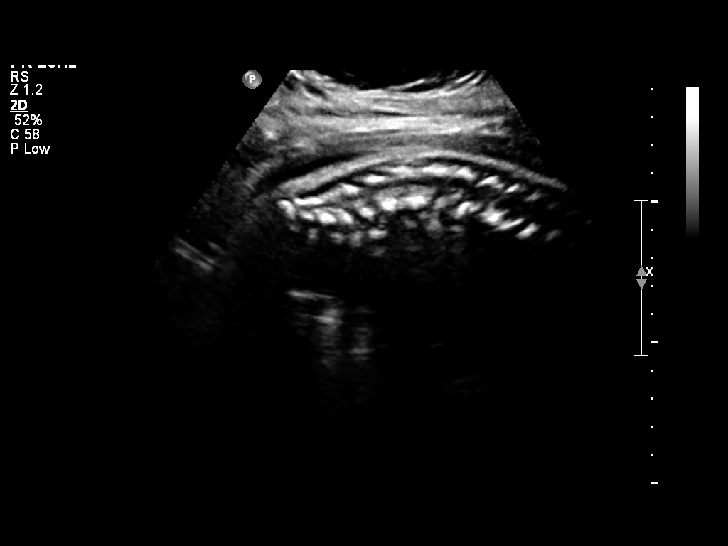
[im 54/80]
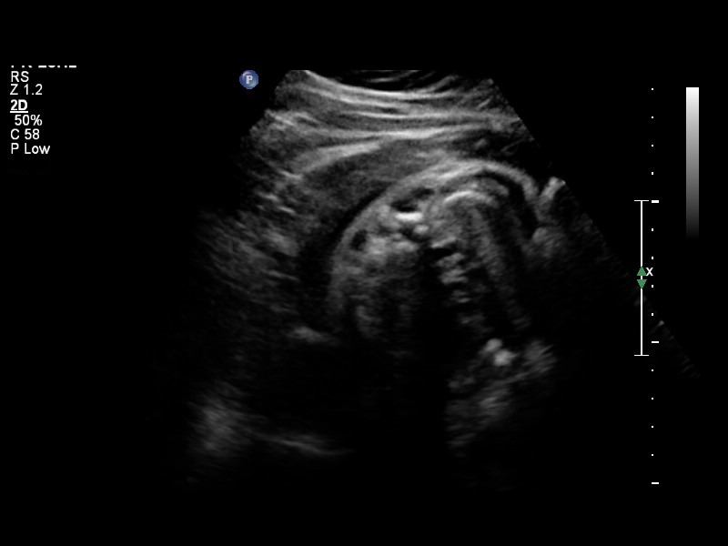
[im 61/80]
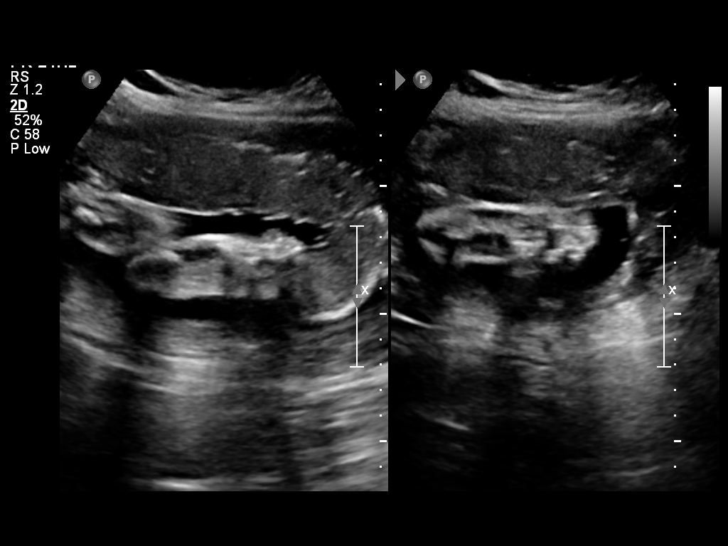
[im 67/80]
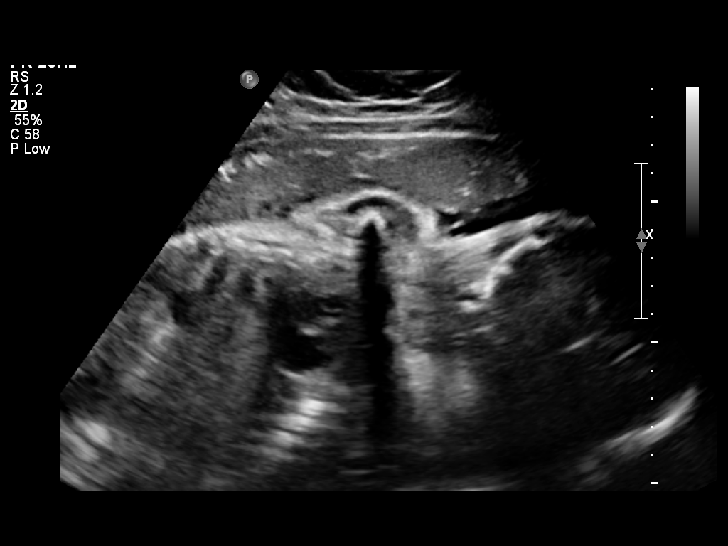
[im 73/80]
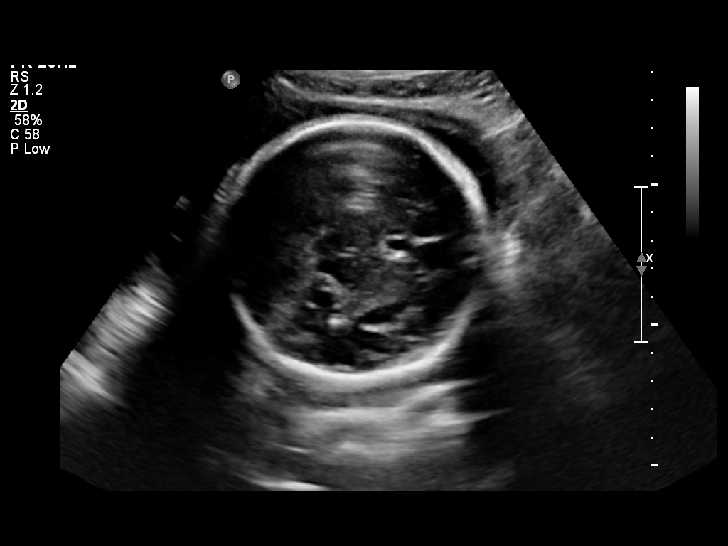
[im 80/80]
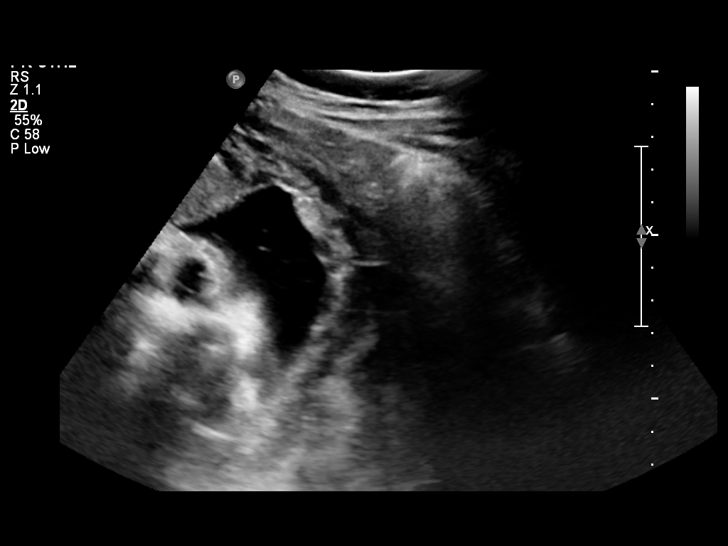

[Series 1: us ob comp +14 wk · 1 of 6 slices shown (2 of 2)]
[im 6/6]
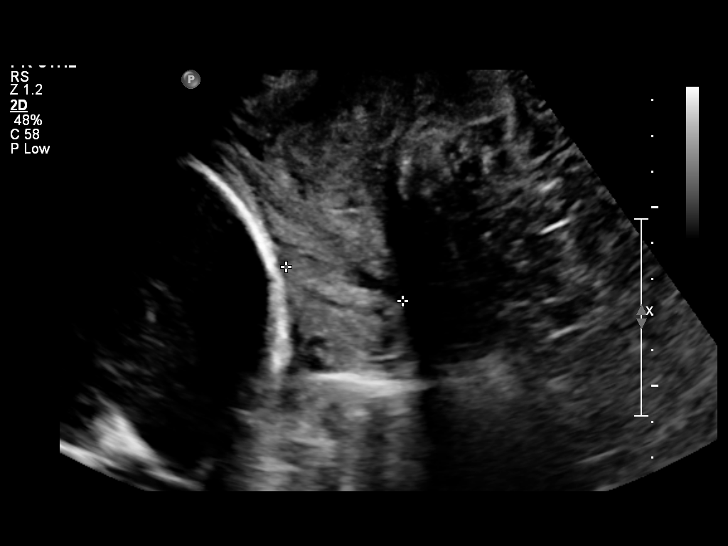

[14 of 28 positions shown; findings below may reference images not displayed]

IMPRESSION: See AS Obstetric US report.

## 2012-08-27 ENCOUNTER — Telehealth: Payer: Self-pay | Admitting: Internal Medicine

## 2012-08-27 MED ORDER — AMPHETAMINE-DEXTROAMPHETAMINE 10 MG PO TABS: ORAL_TABLET | ORAL | Status: DC

## 2012-08-27 NOTE — Telephone Encounter (Signed)
Okay, #90, no refills. Needs office visit before next refill

## 2012-08-27 NOTE — Telephone Encounter (Signed)
LM ON TRIAGE LINE AT 909AM needs refill on Adderral meds wants to pick up today  Pt noted she has no phone # of her own now and will call back later today to check to see if meds are ready

## 2012-08-27 NOTE — Telephone Encounter (Signed)
Pt now has call back number [phone].  Her son has a 2:30 appt at [facility] so she will be near by if she could get the prescription today.

## 2012-08-28 MED ORDER — AMPHETAMINE-DEXTROAMPHET ER 30 MG PO CP24: 30.0000 mg | ORAL_CAPSULE | ORAL | Status: DC

## 2012-08-28 NOTE — Telephone Encounter (Signed)
Ok to RF? 

## 2012-08-28 NOTE — Telephone Encounter (Signed)
Refill done.  

## 2012-08-28 NOTE — Telephone Encounter (Signed)
Pt called today stating she also needs a refill on adderall XR 30 mg. OK to refill? #30

## 2012-09-24 ENCOUNTER — Other Ambulatory Visit: Payer: Self-pay

## 2012-09-24 NOTE — Telephone Encounter (Signed)
Pt call  Requesting enough Rx until appt 10/10/12.   Plz advise   MW

## 2012-09-25 MED ORDER — AMPHETAMINE-DEXTROAMPHETAMINE 10 MG PO TABS: ORAL_TABLET | ORAL | Status: DC

## 2012-09-25 MED ORDER — AMPHETAMINE-DEXTROAMPHET ER 30 MG PO CP24: 30.0000 mg | ORAL_CAPSULE | ORAL | Status: DC

## 2012-09-25 NOTE — Telephone Encounter (Signed)
Due for a OV --make an appointment for next month --I refilled  one month

## 2012-09-25 NOTE — Telephone Encounter (Signed)
Called pt to make pt aware Rx done for one month. Pt appt is already scheduled for 10/10/12.    MW

## 2012-10-10 ENCOUNTER — Ambulatory Visit: Payer: 59 | Admitting: Internal Medicine

## 2012-10-22 ENCOUNTER — Ambulatory Visit (INDEPENDENT_AMBULATORY_CARE_PROVIDER_SITE_OTHER): Payer: 59 | Admitting: Internal Medicine

## 2012-10-22 VITALS — BP 122/84 | HR 106 | Temp 98.0°F | Wt 153.0 lb

## 2012-10-22 DIAGNOSIS — F909 Attention-deficit hyperactivity disorder, unspecified type: Secondary | ICD-10-CM

## 2012-10-22 DIAGNOSIS — F3289 Other specified depressive episodes: Secondary | ICD-10-CM

## 2012-10-22 DIAGNOSIS — IMO0002 Reserved for concepts with insufficient information to code with codable children: Secondary | ICD-10-CM

## 2012-10-22 DIAGNOSIS — R51 Headache: Secondary | ICD-10-CM

## 2012-10-22 DIAGNOSIS — G43909 Migraine, unspecified, not intractable, without status migrainosus: Secondary | ICD-10-CM

## 2012-10-22 DIAGNOSIS — F329 Major depressive disorder, single episode, unspecified: Secondary | ICD-10-CM

## 2012-10-22 DIAGNOSIS — J45909 Unspecified asthma, uncomplicated: Secondary | ICD-10-CM

## 2012-10-22 MED ORDER — AMPHETAMINE-DEXTROAMPHETAMINE 10 MG PO TABS: ORAL_TABLET | ORAL | Status: DC

## 2012-10-22 MED ORDER — AMPHETAMINE-DEXTROAMPHET ER 30 MG PO CP24: 30.0000 mg | ORAL_CAPSULE | ORAL | Status: DC

## 2012-10-22 MED ORDER — CLINDAMYCIN PHOSPHATE 1 % EX GEL: Freq: Two times a day (BID) | CUTANEOUS | Status: DC
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
## 2012-10-22 NOTE — Assessment & Plan Note (Signed)
Off Topamax and Xanax, doing great, will see neurology when necessary. Thinks migraines are much better now because she's not stressed

## 2012-10-22 NOTE — Assessment & Plan Note (Signed)
Diagnosed as a child, currently asymptomatic. Declined a flu shot

## 2012-10-22 NOTE — Assessment & Plan Note (Signed)
Been well controlled with current medications. She is leaving for an intership in [place] x 3 months. I gave her 3 prescriptions for Adderall. First prescription  to be filled this week Second prescription: "Do not RF  before 11/23/2012" Third prescription : "do not RF before  12/25/2011" Will call in 3 months for more RF

## 2012-10-22 NOTE — Assessment & Plan Note (Signed)
See "migraines"
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
## 2012-10-23 ENCOUNTER — Encounter: Payer: Self-pay | Admitting: Internal Medicine

## 2012-12-26 ENCOUNTER — Telehealth: Payer: Self-pay | Admitting: *Deleted

## 2012-12-26 NOTE — Telephone Encounter (Signed)
Pt called to check status of PA. Left Pt message stating that form was completed and faxed back awaiting response and to return call with any further concerns.

## 2012-12-26 NOTE — Telephone Encounter (Signed)
Prior auth request form received.  Awaiting prior auth form to be completed.

## 2012-12-26 NOTE — Telephone Encounter (Signed)
Form has been completed & faxed back to [employer], awaiting approval.

## 2012-12-28 ENCOUNTER — Other Ambulatory Visit (HOSPITAL_BASED_OUTPATIENT_CLINIC_OR_DEPARTMENT_OTHER): Payer: Self-pay | Admitting: *Deleted

## 2012-12-28 DIAGNOSIS — S0990XA Unspecified injury of head, initial encounter: Secondary | ICD-10-CM

## 2012-12-28 DIAGNOSIS — R52 Pain, unspecified: Secondary | ICD-10-CM

## 2012-12-29 ENCOUNTER — Ambulatory Visit (HOSPITAL_BASED_OUTPATIENT_CLINIC_OR_DEPARTMENT_OTHER): Payer: 59

## 2012-12-31 NOTE — Telephone Encounter (Signed)
Pt made aware

## 2013-01-24 ENCOUNTER — Other Ambulatory Visit: Payer: Self-pay | Admitting: *Deleted

## 2013-01-24 MED ORDER — AMPHETAMINE-DEXTROAMPHET ER 30 MG PO CP24: 30.0000 mg | ORAL_CAPSULE | ORAL | Status: DC

## 2013-01-24 MED ORDER — AMPHETAMINE-DEXTROAMPHETAMINE 10 MG PO TABS: ORAL_TABLET | ORAL | Status: DC

## 2013-01-24 NOTE — Telephone Encounter (Signed)
Left Pt detail message, Rx ready for pick up. 

## 2013-02-19 ENCOUNTER — Encounter: Payer: Self-pay | Admitting: Internal Medicine

## 2013-02-19 ENCOUNTER — Telehealth: Payer: Self-pay | Admitting: *Deleted

## 2013-02-19 MED ORDER — AMPHETAMINE-DEXTROAMPHET ER 30 MG PO CP24: 30.0000 mg | ORAL_CAPSULE | ORAL | Status: DC

## 2013-02-19 MED ORDER — AMPHETAMINE-DEXTROAMPHETAMINE 10 MG PO TABS: ORAL_TABLET | ORAL | Status: DC

## 2013-02-19 NOTE — Telephone Encounter (Signed)
Patient left message on triage requesting refill of Adderall 10mg  and 30mg . LAst OV was 10/22/2012 with last refill on 01/24/13 #30 with no refills. Okay to refill?

## 2013-02-19 NOTE — Telephone Encounter (Signed)
Pt made aware rx is ready to be picked up.

## 2013-02-19 NOTE — Telephone Encounter (Signed)
done

## 2013-02-21 ENCOUNTER — Other Ambulatory Visit: Payer: Self-pay | Admitting: *Deleted

## 2013-02-21 DIAGNOSIS — Z9109 Other allergy status, other than to drugs and biological substances: Secondary | ICD-10-CM

## 2013-02-21 MED ORDER — OLOPATADINE HCL 0.1 % OP SOLN: 1.0000 [drp] | Freq: Two times a day (BID) | OPHTHALMIC | Status: DC

## 2013-02-21 NOTE — Telephone Encounter (Signed)
Rx for patanol sent to pharmacy per pt request du to seasonal allergies. This was sent in in March 2013 by Dr. Drue Novel

## 2013-02-26 ENCOUNTER — Other Ambulatory Visit: Payer: Self-pay | Admitting: *Deleted

## 2013-02-26 DIAGNOSIS — J302 Other seasonal allergic rhinitis: Secondary | ICD-10-CM

## 2013-02-26 MED ORDER — CROMOLYN SODIUM 4 % OP SOLN: 1.0000 [drp] | Freq: Four times a day (QID) | OPHTHALMIC | Status: DC

## 2013-02-26 NOTE — Telephone Encounter (Signed)
[facility]rom[facility]yn [facility] [address]

## 2013-03-19 ENCOUNTER — Other Ambulatory Visit: Payer: Self-pay | Admitting: *Deleted

## 2013-03-19 MED ORDER — AMPHETAMINE-DEXTROAMPHETAMINE 10 MG PO TABS: ORAL_TABLET | ORAL | Status: DC

## 2013-03-19 MED ORDER — AMPHETAMINE-DEXTROAMPHET ER 30 MG PO CP24: 30.0000 mg | ORAL_CAPSULE | ORAL | Status: DC

## 2013-03-19 NOTE — Telephone Encounter (Signed)
Rx ready for pick up in AM after 9 am, Pt aware.

## 2013-04-16 ENCOUNTER — Telehealth: Payer: Self-pay | Admitting: General Practice

## 2013-04-16 NOTE — Telephone Encounter (Signed)
Pt called today to request a refill on both Adderall prescriptions. Pt was last seen in office on 10/22/12 and meds last filled on 03/19/13 #30 for both.

## 2013-04-16 NOTE — Telephone Encounter (Signed)
Ok 1 month both

## 2013-04-17 MED ORDER — AMPHETAMINE-DEXTROAMPHETAMINE 10 MG PO TABS: ORAL_TABLET | ORAL | Status: DC

## 2013-04-17 MED ORDER — AMPHETAMINE-DEXTROAMPHET ER 30 MG PO CP24: 30.0000 mg | ORAL_CAPSULE | ORAL | Status: DC

## 2013-04-17 NOTE — Telephone Encounter (Signed)
Meds filled and placed up front for pt. Pt notified.

## 2013-05-15 ENCOUNTER — Telehealth: Payer: Self-pay | Admitting: General Practice

## 2013-05-15 MED ORDER — AMPHETAMINE-DEXTROAMPHETAMINE 10 MG PO TABS: ORAL_TABLET | ORAL | Status: DC

## 2013-05-15 MED ORDER — AMPHETAMINE-DEXTROAMPHET ER 30 MG PO CP24: 30.0000 mg | ORAL_CAPSULE | ORAL | Status: DC

## 2013-05-15 NOTE — Telephone Encounter (Signed)
Ok both, due for OV, let pt know

## 2013-05-15 NOTE — Telephone Encounter (Signed)
Pt made aware rx is ready to be picked up at front desk & due for an OV.

## 2013-05-15 NOTE — Telephone Encounter (Signed)
Adderall refill (both medications) Pt last seen on 10-22-2012 Meds last filled on 04-17-2013

## 2013-06-12 ENCOUNTER — Encounter: Payer: Self-pay | Admitting: Internal Medicine

## 2013-06-12 ENCOUNTER — Ambulatory Visit (INDEPENDENT_AMBULATORY_CARE_PROVIDER_SITE_OTHER): Payer: 59 | Admitting: Internal Medicine

## 2013-06-12 VITALS — BP 115/60 | HR 106 | Temp 98.3°F | Wt 168.8 lb

## 2013-06-12 DIAGNOSIS — F909 Attention-deficit hyperactivity disorder, unspecified type: Secondary | ICD-10-CM

## 2013-06-12 DIAGNOSIS — L708 Other acne: Secondary | ICD-10-CM

## 2013-06-12 MED ORDER — AMPHETAMINE-DEXTROAMPHET ER 30 MG PO CP24: 30.0000 mg | ORAL_CAPSULE | ORAL | Status: DC

## 2013-06-12 MED ORDER — MUPIROCIN CALCIUM 2 % EX CREA: TOPICAL_CREAM | Freq: Three times a day (TID) | CUTANEOUS | Status: DC

## 2013-06-12 MED ORDER — AMPHETAMINE-DEXTROAMPHETAMINE 10 MG PO TABS: ORAL_TABLET | ORAL | Status: DC

## 2013-06-12 MED ORDER — CLINDAMYCIN PHOSPHATE 1 % EX GEL: Freq: Two times a day (BID) | CUTANEOUS | Status: DC

## 2013-06-12 NOTE — Progress Notes (Signed)
  Subjective:    Patient ID: Connie Lewis, female    DOB: 1985/12/25, 27 y.o.   MRN: [id number]  HPI Acute visit 6 weeks ago had several insect bites at the left leg, the areas oozed some discharge on and off, they are  not completely healed and she is concerned. 2 days ago she used leftover mupirocin which is helping well, areas better today. Also has a rash in the upper chest, previously has used an antibiotic gel that worked. Also has a history of ADHD, good compliance of medication, she takes XR 30 mg in the morning and a quick release 10 mg at around 2 PM, from time to time she has difficulty concentrating at night,  she goes to school late.   Review of Systems No anxiety or depression.    Objective:   Physical Exam  Constitutional: She appears well-developed and well-nourished.  Skin:     Psychiatric: She has a normal mood and affect. Her behavior is normal. Judgment and thought content normal.      Assessment & Plan:

## 2013-06-12 NOTE — Assessment & Plan Note (Signed)
See history of present illness, occasional difficulty concentrating when she goes to classes at 6 PM. Continue with adderall XR 30 mg in the morning. Continue Adderall 10 mg in the afternoon, okay to take it a 4 to 5 PM the days she has   classes.

## 2013-06-12 NOTE — Assessment & Plan Note (Signed)
Refill Clindagel to be used in the chest also has some skin lesions in the leg, skin was completely normal before the lesions show up 6 weeks ago. Prescribed mupirocin, will call if not better.

## 2013-06-12 NOTE — Patient Instructions (Addendum)
Rash at the legs: Use mupirocin 3 times a day for 10 days, if the skin is not back to normal in 2 weeks let me knowl. Rash of the chest: Use clindagel twice a day, call if no better  Next visit 6 months

## 2013-06-13 ENCOUNTER — Encounter: Payer: Self-pay | Admitting: Internal Medicine

## 2013-07-15 ENCOUNTER — Telehealth: Payer: Self-pay | Admitting: *Deleted
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
## 2013-07-15 NOTE — Telephone Encounter (Signed)
Ok to refill adderall? Last OV 7.9.14 Last filled 7.9.14 # 30 no refills (there are two rx on file for this drug from 06/12/13, one is for one tablet in the morning and then one for one tablet at 4-5pm daily as needed)  Contract on file Low risk

## 2013-07-16 MED ORDER — AMPHETAMINE-DEXTROAMPHETAMINE 10 MG PO TABS: ORAL_TABLET | ORAL | Status: DC

## 2013-07-16 MED ORDER — AMPHETAMINE-DEXTROAMPHET ER 30 MG PO CP24: 30.0000 mg | ORAL_CAPSULE | Freq: Every day | ORAL | Status: DC | PRN

## 2013-07-16 NOTE — Telephone Encounter (Signed)
She takes both: adderall XR 30 mg in the morning.    Adderall 10 mg in the afternoon RF printed

## 2013-07-16 NOTE — Telephone Encounter (Signed)
lmovm to contact office to pick up rx.

## 2013-07-16 NOTE — Addendum Note (Signed)
Addended by: Willow Ora E on: 07/16/2013 01:02 PM   Modules accepted: Orders

## 2013-08-12 ENCOUNTER — Telehealth: Payer: Self-pay | Admitting: General Practice

## 2013-08-12 MED ORDER — AMPHETAMINE-DEXTROAMPHETAMINE 10 MG PO TABS: ORAL_TABLET | ORAL | Status: DC

## 2013-08-12 MED ORDER — AMPHETAMINE-DEXTROAMPHET ER 30 MG PO CP24: 30.0000 mg | ORAL_CAPSULE | Freq: Every day | ORAL | Status: DC | PRN

## 2013-08-12 NOTE — Telephone Encounter (Signed)
amphetamine-dextroamphetamine (ADDERALL XR) 30 MG 24 hr capsule  amphetamine-dextroamphetamine (ADDERALL) 10 MG tablet Last OV 06-12-2013 Meds last filled 07-16-13  Contract on File Low Risk Last test on 01-24-13

## 2013-08-12 NOTE — Telephone Encounter (Signed)
Ok RF, Rx printed

## 2013-08-29 ENCOUNTER — Telehealth: Payer: Self-pay | Admitting: *Deleted

## 2013-08-29 ENCOUNTER — Encounter: Payer: Self-pay | Admitting: *Deleted

## 2013-08-29 NOTE — Telephone Encounter (Signed)
Patient was contacted to inform her that a written consent from her is required to complete her request. She came by the office and signed a medical release of information and the form was faxed by Waynetta Sandy to Daylene Posey at [phone].

## 2013-08-29 NOTE — Telephone Encounter (Signed)
Please make a copy of her medication list and send it to the patient. If she likes [place] to send a copy to somebody else, we need written consent.

## 2013-08-29 NOTE — Telephone Encounter (Signed)
Patient called and stated that she had an interview yesterday and that the company would like proof from our office stating that she is currently taking Adderrall. She would like this letter faxed to Daylene Posey at [phone]. Please advise.

## 2013-09-10 ENCOUNTER — Telehealth: Payer: Self-pay | Admitting: *Deleted

## 2013-09-10 MED ORDER — AMPHETAMINE-DEXTROAMPHETAMINE 10 MG PO TABS: ORAL_TABLET | ORAL | Status: DC

## 2013-09-10 MED ORDER — AMPHETAMINE-DEXTROAMPHET ER 30 MG PO CP24: 30.0000 mg | ORAL_CAPSULE | Freq: Every day | ORAL | Status: DC | PRN

## 2013-09-10 NOTE — Telephone Encounter (Signed)
Pt is requesting refill on Adderall 30mg  and Adderall 10mg . . Pt was last seen on 06/12/2013. Adderall was last filled on 08/12/2013,UDS on 01/25/2013-low risk, contract signed. Please advise. SW, CMA

## 2013-09-10 NOTE — Telephone Encounter (Signed)
Ok RF 

## 2013-10-08 ENCOUNTER — Telehealth: Payer: Self-pay | Admitting: *Deleted

## 2013-10-08 MED ORDER — AMPHETAMINE-DEXTROAMPHET ER 30 MG PO CP24: 30.0000 mg | ORAL_CAPSULE | Freq: Every day | ORAL | Status: DC | PRN

## 2013-10-08 MED ORDER — AMPHETAMINE-DEXTROAMPHETAMINE 10 MG PO TABS: ORAL_TABLET | ORAL | Status: DC

## 2013-10-08 NOTE — Telephone Encounter (Signed)
Done

## 2013-10-08 NOTE — Telephone Encounter (Signed)
Patient is requesting a refill of Adderall.  Last OV 06/12/13 Last filled 09/10/13 #30 Contract on file Low Risk UDS Okay to refill?

## 2013-10-18 ENCOUNTER — Encounter: Payer: Self-pay | Admitting: *Deleted

## 2013-10-18 ENCOUNTER — Telehealth: Payer: Self-pay | Admitting: *Deleted

## 2013-10-18 NOTE — Telephone Encounter (Signed)
Patient called and stated that she had Dr. Drue Novel type out a letter for her job that she was taking Adderall. Patient called today and states that at that time the job didn't need it but unfortunately they need it now. Letter was printed and faxed to the information that was provided by the patient. SW

## 2013-10-21 ENCOUNTER — Encounter: Payer: Self-pay | Admitting: *Deleted

## 2013-11-05 ENCOUNTER — Telehealth: Payer: Self-pay | Admitting: *Deleted

## 2013-11-05 MED ORDER — AMPHETAMINE-DEXTROAMPHET ER 30 MG PO CP24: 30.0000 mg | ORAL_CAPSULE | Freq: Every day | ORAL | Status: DC | PRN

## 2013-11-05 NOTE — Telephone Encounter (Signed)
I refilled prescription for Adderall XR, does she also needs the regular Adderall?

## 2013-11-05 NOTE — Telephone Encounter (Signed)
Patient is requesting refill for Adderall.  Last seen-06/12/2013  Last filled-10/08/2013  UDS-01/25/2013 low risk, contract signed   Please advise. SW

## 2013-11-06 MED ORDER — AMPHETAMINE-DEXTROAMPHETAMINE 10 MG PO TABS: ORAL_TABLET | ORAL | Status: DC

## 2013-11-06 NOTE — Telephone Encounter (Signed)
Sorry, yes would like the regular Adderall filled also.

## 2013-11-06 NOTE — Telephone Encounter (Signed)
done

## 2013-11-06 NOTE — Telephone Encounter (Signed)
Left message on voicemail asking if she needs a refill on her regular Adderall. Ask patient to return call to office with this information.

## 2013-11-06 NOTE — Addendum Note (Signed)
Addended by: Willow Ora E on: 11/06/2013 11:48 AM   Modules accepted: Orders

## 2013-11-15 ENCOUNTER — Telehealth: Payer: Self-pay | Admitting: Internal Medicine

## 2013-11-15 NOTE — Telephone Encounter (Signed)
Advise pt: UDS showed hydrocodone, is high risk, will need UDS w/ next Rx, also needs a OV

## 2013-11-19 NOTE — Telephone Encounter (Signed)
Pt states was seen at urgent care was given hydrocodone cough syrup notified Dr.Paz on last visit. Will call back to schedule OV.

## 2013-11-21 ENCOUNTER — Telehealth: Payer: Self-pay | Admitting: *Deleted

## 2013-11-21 NOTE — Telephone Encounter (Signed)
UDS on 11/08/13 deemed High Risk by provider. Patient was positive for hydrocodone and hydromorphone

## 2013-12-10 ENCOUNTER — Telehealth: Payer: Self-pay | Admitting: *Deleted

## 2013-12-10 MED ORDER — AMPHETAMINE-DEXTROAMPHET ER 30 MG PO CP24: 30.0000 mg | ORAL_CAPSULE | Freq: Every day | ORAL | Status: DC | PRN

## 2013-12-10 MED ORDER — AMPHETAMINE-DEXTROAMPHETAMINE 10 MG PO TABS: ORAL_TABLET | ORAL | Status: DC

## 2013-12-10 NOTE — Telephone Encounter (Signed)
Prescription printed, needs to keep her office visit 12/31/2013.

## 2013-12-10 NOTE — Telephone Encounter (Signed)
Patient called and left message on triage phone to return call. Returned call to patient and left message to call the office.

## 2013-12-10 NOTE — Telephone Encounter (Signed)
Patient called and stated that she would like to know if she could get a refill on her Adderall. Patient UDS was high because she was prescribed hydrocodone cough syrup. Patient states that she is willing to bring in the medication bottle to her appointment. Patient did schedule an OV for 12/31/2013 @ 815. Please advise. SW

## 2013-12-11 ENCOUNTER — Emergency Department (INDEPENDENT_AMBULATORY_CARE_PROVIDER_SITE_OTHER)
Admission: EM | Admit: 2013-12-11 | Discharge: 2013-12-11 | Disposition: A | Discharge status: Home / Self Care | Payer: 59 | Source: Home / Self Care | Attending: Family Medicine | Admitting: Family Medicine

## 2013-12-11 ENCOUNTER — Encounter: Payer: Self-pay | Admitting: Emergency Medicine

## 2013-12-11 DIAGNOSIS — J111 Influenza due to unidentified influenza virus with other respiratory manifestations: Secondary | ICD-10-CM

## 2013-12-11 DIAGNOSIS — R69 Illness, unspecified: Principal | ICD-10-CM

## 2013-12-11 MED ORDER — OSELTAMIVIR PHOSPHATE 75 MG PO CAPS: 75.0000 mg | ORAL_CAPSULE | Freq: Two times a day (BID) | ORAL | Status: DC

## 2013-12-11 MED ORDER — BENZONATATE 200 MG PO CAPS: 200.0000 mg | ORAL_CAPSULE | Freq: Every day | ORAL | Status: DC

## 2013-12-11 NOTE — Telephone Encounter (Signed)
Patient was notified.

## 2013-12-11 NOTE — Discharge Instructions (Signed)
Take plain Mucinex (1200 mg guaifenesin) twice daily for cough and congestion.  May add Sudafed for sinus congestion.   Increase fluid intake, rest. May use Afrin nasal spray (or generic oxymetazoline) twice daily for about 5 days.  Also recommend using saline nasal spray several times daily and saline nasal irrigation (AYR is a common brand) Try warm salt water gargles for sore throat.  Stop all antihistamines for now, and other non-prescription cough/cold preparations. May take Ibuprofen 200mg , 4 tabs every 8 hours with food for chest/sternum discomfort, fever, headache, etc. Recommend a flu shot when well.  Follow-up with family doctor if not improving 7 to 10 days.

## 2013-12-11 NOTE — ED Notes (Signed)
Connie Lewis c/o dry cough, HA, chills, fatigue, right ear pain and fever x 1-2 days. Tylenol @ 930am today. No flu vac this season.

## 2013-12-11 NOTE — ED Provider Notes (Signed)
CSN: [id number]     Arrival date & time 12/11/13  1101 History   First MD Initiated Contact with Patient 12/11/13 1154     Chief Complaint  Patient presents with  . Cough  . Chills      HPI Comments: Yesterday patient suddenly developed sore throat, myalgias, headache, nasal congestion, chills/sweats, and non-productive cough.  She has mild right ear ache today.   She has not had influenza immunization for this season.    The history is provided by the patient.    Past Medical History  Diagnosis Date  . Migraine     dx during pregnancy  . ADHD (attention deficit hyperactivity disorder)   . Allergic rhinitis   . Asthma   . GERD (gastroesophageal reflux disease)   . Depression     dx during her pregnancy in 2000   Past Surgical History  Procedure Laterality Date  . Tonsillectomy    . Wisdom tooth extraction     Family History  Problem Relation Age of Onset  . Diabetes Mother   . Hypertension Mother   . Heart attack      great uncle   . Thyroid disease Father   . Atrial fibrillation Father   . Hypertension Father   . Thyroid disease Sister    History  Substance Use Topics  . Smoking status: Former Smoker    Quit date: 12/11/2009  . Smokeless tobacco: Never Used  . Alcohol Use: Yes     Comment: socially    OB History   Grav Para Term Preterm Abortions TAB SAB Ect Mult Living                 Review of Systems + sore throat + cough No pleuritic pain but has tightness in anterior chest. No wheezing + nasal congestion + post-nasal drainage No sinus pain/pressure No itchy/red eyes ? earache No hemoptysis No SOB + fever, + chills + nausea No vomiting No abdominal pain No diarrhea No urinary symptoms No skin rash + fatigue + myalgias + headache Used OTC meds without relief  Allergies  Zithromax  Home Medications   Current Outpatient Rx  Name  Route  Sig  Dispense  Refill  . benzonatate (TESSALON) 200 MG capsule   Oral   Take 1 capsule (200 mg  total) by mouth at bedtime. Take as needed for cough   12 capsule   0   . clindamycin (CLINDAGEL) 1 % gel   Topical   Apply topically 2 (two) times daily.   30 g   6   . Multiple Vitamin (MULTIVITAMIN) capsule   Oral   Take 1 capsule by mouth daily.           Marland Kitchen oseltamivir (TAMIFLU) 75 MG capsule   Oral   Take 1 capsule (75 mg total) by mouth every 12 (twelve) hours.   10 capsule   0    BP 121/70  Pulse 120  Temp(Src) 100.1 F (37.8 C) (Oral)  Resp 16  Ht 5\' 7"  (1.702 m)  Wt 176 lb (79.833 kg)  BMI 27.56 kg/m2  SpO2 100%  LMP 12/06/2013 Physical Exam Nursing notes and Vital Signs reviewed. Appearance:  Patient appears healthy, stated age, and in no acute distress Eyes:  Pupils are equal, round, and reactive to light and accomodation.  Extraocular movement is intact.  Conjunctivae are not inflamed  Ears:  Canals normal.  Tympanic membranes normal.  Nose:  Mildly congested turbinates.  No sinus tenderness.  Pharynx:  Normal Neck:  Supple.   Tender enlarged posterior nodes are palpated bilaterally  Lungs:  Clear to auscultation.  Breath sounds are equal.  Chest:  Distinct tenderness to palpation over the mid-sternum.  Heart:  Regular rate and rhythm without murmurs, rubs, or gallops.  Abdomen:  Nontender without masses or hepatosplenomegaly.  Bowel sounds are present.  No CVA or flank tenderness.  Extremities:  No edema.  No calf tenderness Skin:  No rash present.   ED Course  Procedures  none     MDM   1. Influenza-like illness    Begin Tamiflu.  Prescription written for Benzonatate [facility]) to take at bedtime for night-time cough.  Take plain Mucinex (1200 mg guaifenesin) twice daily for cough and congestion.  May add Sudafed for sinus congestion.   Increase fluid intake, rest. May use Afrin nasal spray (or generic oxymetazoline) twice daily for about 5 days.  Also recommend using saline nasal spray several times daily and saline nasal irrigation (AYR is a  common brand) Try warm salt water gargles for sore throat.  Stop all antihistamines for now, and other non-prescription cough/cold preparations. May take Ibuprofen 200mg , 4 tabs every 8 hours with food for chest/sternum discomfort, fever, headache, etc. Recommend a flu shot when well.  Follow-up with family doctor if not improving 7 to 10 days.     Kandra Nicolas, MD 12/11/13 1242

## 2013-12-12 ENCOUNTER — Telehealth: Payer: Self-pay | Admitting: *Deleted

## 2013-12-31 ENCOUNTER — Encounter: Payer: Self-pay | Admitting: Internal Medicine

## 2013-12-31 ENCOUNTER — Ambulatory Visit (INDEPENDENT_AMBULATORY_CARE_PROVIDER_SITE_OTHER): Payer: 59 | Admitting: Internal Medicine

## 2013-12-31 VITALS — BP 127/78 | HR 100 | Temp 98.3°F | Wt 170.0 lb

## 2013-12-31 DIAGNOSIS — R05 Cough: Secondary | ICD-10-CM

## 2013-12-31 DIAGNOSIS — R059 Cough, unspecified: Secondary | ICD-10-CM

## 2013-12-31 DIAGNOSIS — F909 Attention-deficit hyperactivity disorder, unspecified type: Secondary | ICD-10-CM

## 2013-12-31 MED ORDER — AMPHETAMINE-DEXTROAMPHET ER 30 MG PO CP24: 30.0000 mg | ORAL_CAPSULE | Freq: Every day | ORAL | Status: DC

## 2013-12-31 MED ORDER — HYDROCODONE-HOMATROPINE 5-1.5 MG/5ML PO SYRP: 5.0000 mL | ORAL_SOLUTION | Freq: Every evening | ORAL | Status: DC | PRN

## 2013-12-31 MED ORDER — AMPHETAMINE-DEXTROAMPHETAMINE 10 MG PO TABS: 10.0000 mg | ORAL_TABLET | Freq: Every day | ORAL | Status: DC | PRN

## 2013-12-31 MED ORDER — AMOXICILLIN 500 MG PO CAPS: 1000.0000 mg | ORAL_CAPSULE | Freq: Two times a day (BID) | ORAL | Status: DC

## 2013-12-31 NOTE — Progress Notes (Signed)
   Subjective:    Patient ID: Connie Lewis, female    DOB: 15-Mar-1986, 28 y.o.   MRN: [id number]  HPI Here to discuss several issues: ADHD, we discussed her most recent UDS, see assessment and plan. Went to the ER on 12-11-13, diagnosed with the flu, Rx Tamiflu but couldn't afford it , now is better but has a persisting cough, worse at night.  Past Medical History  Diagnosis Date  . Migraine     dx during pregnancy  . ADHD (attention deficit hyperactivity disorder)   . Allergic rhinitis   . Asthma   . GERD (gastroesophageal reflux disease)   . Depression     dx during her pregnancy in 2000   Past Surgical History  Procedure Laterality Date  . Tonsillectomy    . Wisdom tooth extraction     History   Social History  . Marital Status: Single    Spouse Name: N/A    Number of Children: 1  . Years of Education: N/A   Occupational History  . student, bar tender part time     Social History Main Topics  . Smoking status: Former Smoker    Quit date: 12/11/2009  . Smokeless tobacco: Never Used  . Alcohol Use: Yes     Comment: socially   . Drug Use: No  . Sexual Activity: Not on file   Other Topics Concern  . Not on file   Social History Narrative   Household pt and her son     Review of Systems Denies fever or chills Admits to occasional greenish sputum and nasal discharge mostly in the mornings. Occasional wheezing. Denies nausea, vomiting, diarrhea Reports some anxiety, see assessment and plan.     Objective:   Physical Exam BP 127/78  Pulse 100  Temp(Src) 98.3 F (36.8 C)  Wt 170 lb (77.111 kg)  SpO2 98%  LMP 12/06/2013 General -- alert, well-developed, NAD. Persistent cough noted  HEENT-- Not pale. TMs normal, throat symmetric, no redness or discharge. Face symmetric, sinuses slt tender to palpation at L maxilary sinus. Nose congested. Lungs -- normal respiratory effort, no intercostal retractions, no accessory muscle use, and normal breath  sounds.  Heart-- normal rate, regular rhythm, no murmur.   Extremities-- no pretibial edema bilaterally  Neurologic--  alert & oriented X3. Speech normal, gait normal, strength normal in all extremities.  Psych-- Cognition and judgment appear intact. Cooperative with normal attention span and concentration. No anxious or depressed appearing.      Assessment & Plan:   Cough, Persisting cough and sinus congestion since she had the flu earlier this month. She is a slightly tender at the maxillary sinus, sinusitis?. Plan: Amoxicillin Cough suppression with Mucinex and hydrocodone, prescription provided. Call if not better. Has a tendency to develop yeast infections with antibiotics, will call for Diflucan if that is the case

## 2013-12-31 NOTE — Progress Notes (Signed)
Pre visit review using our clinic review tool, if applicable. No additional management support is needed unless otherwise documented below in the visit note. 

## 2013-12-31 NOTE — Patient Instructions (Signed)
Go to the lab now for a UDS    For cough, take Mucinex DM twice a day x 10 days, the as needed  Hydrocodone at night if cough severe For congestion use OTC Nasocort: 2 nasal sprays on each side of the nose daily until you feel better  Take the antibiotic as prescribed  (Amoxicillin) x 10 days  Call if no better in few days Call anytime if the symptoms are severe  Next visit for ADD in 6 months

## 2013-12-31 NOTE — Assessment & Plan Note (Addendum)
Symptoms well controlled with current medication. Refill provided today. UDS 11/08/2013 showed hydrocodone, at the time the patient was taking cough medication. We'll repeat it UDS today, she did mention she took a leftover Xanax 3 days ago. Will keep that in mind.  She has hydrocodone at home for cough and I'm giving her a prescription for the same today today but she reports she has not been taking that in few days. She is not on birth control pills, very aware she cannot take medications if she gets pregnant

## 2014-01-08 ENCOUNTER — Telehealth: Payer: Self-pay

## 2014-01-08 NOTE — Telephone Encounter (Signed)
UDS: 01/01/2014 Positive for Adderall Neg for Hydrocodone and hydromorphone--not taking  Neg--a Hydroxyalprazolam  Low risk per Dr Larose Kells

## 2014-01-31 ENCOUNTER — Telehealth: Payer: Self-pay | Admitting: *Deleted

## 2014-01-31 MED ORDER — AMPHETAMINE-DEXTROAMPHET ER 30 MG PO CP24: 30.0000 mg | ORAL_CAPSULE | Freq: Every day | ORAL | Status: DC

## 2014-01-31 MED ORDER — AMPHETAMINE-DEXTROAMPHETAMINE 10 MG PO TABS: 10.0000 mg | ORAL_TABLET | Freq: Every day | ORAL | Status: DC | PRN

## 2014-01-31 NOTE — Telephone Encounter (Signed)
Done

## 2014-01-31 NOTE — Telephone Encounter (Signed)
lmovm

## 2014-01-31 NOTE — Telephone Encounter (Signed)
Patient is requesting a refill on Adderall (30mg  XR and 10mg ) Last OV 12/31/13 Last filled 12/31/13 #30 for both Agreement on file UDS low risk Okay to refill?

## 2014-02-21 ENCOUNTER — Telehealth: Payer: Self-pay | Admitting: *Deleted

## 2014-02-21 MED ORDER — AMPHETAMINE-DEXTROAMPHET ER 30 MG PO CP24: 30.0000 mg | ORAL_CAPSULE | Freq: Every day | ORAL | Status: DC

## 2014-02-21 MED ORDER — AMPHETAMINE-DEXTROAMPHETAMINE 10 MG PO TABS: 10.0000 mg | ORAL_TABLET | Freq: Every day | ORAL | Status: DC | PRN

## 2014-02-21 NOTE — Telephone Encounter (Signed)
Pts mother picked up rx on 02-03-14 for daughter.

## 2014-02-21 NOTE — Telephone Encounter (Signed)
Pts mother notified of pick up. rx rdy for pick up at our front desk.

## 2014-02-21 NOTE — Telephone Encounter (Signed)
Refill x1 

## 2014-02-21 NOTE — Telephone Encounter (Signed)
Pt called requesting refill for amphetamine-dextroamphetamine (ADDERALL XR) 30 MG 24 hr capsule & amphetamine-dextroamphetamine (ADDERALL) 10 MG tablet. Also she wanted to give consent that her mother can pick up her prescriptions since she works at [employer].  Last OV- 12/31/13 Last refilled - 01/31/14 #30 / 0 rf  UDS - 01/01/14 LOW risk

## 2014-02-26 ENCOUNTER — Encounter: Payer: Self-pay | Admitting: Internal Medicine

## 2014-02-27 NOTE — Telephone Encounter (Signed)
Error

## 2014-03-05 ENCOUNTER — Encounter: Payer: Self-pay | Admitting: Internal Medicine

## 2014-03-06 ENCOUNTER — Telehealth: Payer: Self-pay | Admitting: *Deleted

## 2014-03-06 NOTE — Telephone Encounter (Signed)
Prior authorization paperwork for Adderall ER 30 mg capsules faxed. JG//CMA

## 2014-03-10 NOTE — Telephone Encounter (Signed)
Received fax from [facility] stating that Prior Authorization is not required at this time. The prior authorization request was cancelled. Letter for cancellation sent for scanning. JG//CMA

## 2014-03-28 ENCOUNTER — Telehealth: Payer: Self-pay | Admitting: Internal Medicine

## 2014-03-28 MED ORDER — AMPHETAMINE-DEXTROAMPHET ER 30 MG PO CP24: 30.0000 mg | ORAL_CAPSULE | Freq: Every day | ORAL | Status: DC

## 2014-03-28 MED ORDER — AMPHETAMINE-DEXTROAMPHETAMINE 10 MG PO TABS: 10.0000 mg | ORAL_TABLET | Freq: Every day | ORAL | Status: DC | PRN

## 2014-03-28 NOTE — Telephone Encounter (Signed)
Caller name:Connie Lewis Relation to FF:[id number] PCP:Dr Paz Call back [place]:  Reason for call: Patient called and requested a refill for amphetamine-dextroamphetamine (ADDERALL) 10 MG tablet and amphetamine-dextroamphetamine (ADDERALL XR) 30 MG 24 hr capsule

## 2014-03-28 NOTE — Telephone Encounter (Signed)
Requesting Adderall 10mg  and Adderall XR 30mg -Take 1 tablet by mouth daily. Last refills:02-21-14;#30,0 Last OV:12-31-13 UDS;01-01-14-Low risk Please advise.//AB/CMA

## 2014-03-28 NOTE — Telephone Encounter (Signed)
Ok both 1 month supply, please print rx

## 2014-03-31 NOTE — Telephone Encounter (Signed)
Pt notified. rx rdy for pick up at front desk.  

## 2014-04-24 ENCOUNTER — Telehealth: Payer: Self-pay | Admitting: Internal Medicine

## 2014-04-24 MED ORDER — AMPHETAMINE-DEXTROAMPHET ER 30 MG PO CP24: 30.0000 mg | ORAL_CAPSULE | Freq: Every day | ORAL | Status: DC

## 2014-04-24 MED ORDER — AMPHETAMINE-DEXTROAMPHETAMINE 10 MG PO TABS: 10.0000 mg | ORAL_TABLET | Freq: Every day | ORAL | Status: DC | PRN

## 2014-04-24 NOTE — Telephone Encounter (Signed)
Caller name: Gussie Relation to pt: self  Call back number: Mom [phone]  Self [phone] Pharmacy:  Reason for call:  Pt is needing the RX's amphetamine-dextroamphetamine (ADDERALL XR) 30 MG 24 hr capsule and the amphetamine-dextroamphetamine (ADDERALL) 10 MG tablet   Mom will be picking up, so you can contact mom or pt when complete.

## 2014-04-24 NOTE — Telephone Encounter (Signed)
Spoke with patients mom and advised that Rx for Adderall was ready to be picked up.

## 2014-04-24 NOTE — Telephone Encounter (Signed)
Done

## 2014-04-24 NOTE — Telephone Encounter (Signed)
amphetamine-dextroamphetamine (ADDERALL XR) 30 MG 24 hr capsule Last refill:  4.24.2015 #30   amphetamine-dextroamphetamine (ADDERALL) 10 MG tablet Last refill: 4.24.2015 #30  Last OV: 1.27.2015 Last UDS: 1.28.2015, low risk

## 2014-05-21 ENCOUNTER — Telehealth: Payer: Self-pay | Admitting: Internal Medicine

## 2014-05-21 NOTE — Telephone Encounter (Signed)
Caller name: Zaylie  Call back [place]   Reason for call: pt is needing a refill on BOTH RX of the adderall.

## 2014-05-21 NOTE — Telephone Encounter (Signed)
rx refill

## 2014-05-21 NOTE — Telephone Encounter (Signed)
rx refill- adderall 30mg  xr &10mg   Last refilled- 04/24/14 #30 / 0 rf both  Last OV- 12/31/13 UDS- 01/01/14 LOW risk.

## 2014-05-22 MED ORDER — AMPHETAMINE-DEXTROAMPHETAMINE 10 MG PO TABS: 10.0000 mg | ORAL_TABLET | Freq: Every day | ORAL | Status: DC | PRN

## 2014-05-22 MED ORDER — AMPHETAMINE-DEXTROAMPHET ER 30 MG PO CP24: 30.0000 mg | ORAL_CAPSULE | Freq: Every day | ORAL | Status: DC

## 2014-05-22 NOTE — Telephone Encounter (Signed)
done

## 2014-05-23 NOTE — Telephone Encounter (Signed)
Pt notified of pick up.

## 2014-06-12 ENCOUNTER — Telehealth: Payer: Self-pay | Admitting: Internal Medicine

## 2014-06-12 NOTE — Telephone Encounter (Signed)
Caller name: Vaughan Basta  Relation to OE:[id number] Call back number: [phone] Pharmacy:  Reason for call: patient called to request a refill for Adderall and Adderall XR

## 2014-06-13 MED ORDER — AMPHETAMINE-DEXTROAMPHETAMINE 10 MG PO TABS: 10.0000 mg | ORAL_TABLET | Freq: Every day | ORAL | Status: DC | PRN

## 2014-06-13 MED ORDER — AMPHETAMINE-DEXTROAMPHET ER 30 MG PO CP24: 30.0000 mg | ORAL_CAPSULE | Freq: Every day | ORAL | Status: DC

## 2014-06-13 NOTE — Telephone Encounter (Signed)
rx refill- Adderall and adderall XR Last OV- 12/31/13 Last refilled- 05/22/14 #30/ 0 rf  UDS- 01/01/14 Low risk.

## 2014-06-13 NOTE — Telephone Encounter (Signed)
Ok RF Advise pt she is die for a OV next month

## 2014-06-13 NOTE — Telephone Encounter (Signed)
lmovm to pick up rx at front desk and schedule OV

## 2014-07-11 ENCOUNTER — Telehealth: Payer: Self-pay | Admitting: Internal Medicine

## 2014-07-11 NOTE — Telephone Encounter (Signed)
Pt currently does not have insurance. Insurance will not kick in from her job until Sept. Pt scheduled a 6 month med check with Dr. Larose Kells 08/22/14  but is requesting a 30 day refill for amphetamine-dextroamphetamine (ADDERALL XR) 30 MG 24 hr capsule and amphetamine-dextroamphetamine (ADDERALL) 10 MG tablet. Pt is requesting mother to pick up script. Please advise

## 2014-07-11 NOTE — Telephone Encounter (Signed)
rx refill- adderall 10 mg & adderall XR 30 mg  Last refilled- 06/13/14 #30 / 0 rf  Last OV- 12/31/13  UDS- 01/01/14 Low risk.

## 2014-07-14 MED ORDER — AMPHETAMINE-DEXTROAMPHET ER 30 MG PO CP24: 30.0000 mg | ORAL_CAPSULE | Freq: Every day | ORAL | Status: DC

## 2014-07-14 MED ORDER — AMPHETAMINE-DEXTROAMPHETAMINE 10 MG PO TABS: 10.0000 mg | ORAL_TABLET | Freq: Every day | ORAL | Status: DC | PRN

## 2014-07-14 NOTE — Telephone Encounter (Signed)
LMOVM

## 2014-07-14 NOTE — Telephone Encounter (Signed)
ok RF Due for a OV, be sure pt knows

## 2014-08-21 ENCOUNTER — Telehealth: Payer: Self-pay | Admitting: Internal Medicine

## 2014-08-21 NOTE — Telephone Encounter (Signed)
Caller name: Kaisa  Call back [place]   Reason for call:  Pt is needing a refill on Rx :  amphetamine-dextroamphetamine (ADDERALL XR) 30 MG 24 hr capsule  amphetamine-dextroamphetamine (ADDERALL) 10 MG tablet   Pt has Med Follow up on 10/2 with Dr. Larose Kells, and wants to know if she can get these filled until she comes in for the apt.

## 2014-08-21 NOTE — Telephone Encounter (Signed)
Pt is requesting refill for Adderall XR 30MG  and Adderall 10 mg.  OV: 12/31/2013. Pt has appt scheduled 09/05/2014. Last Fill: 07/14/2014 # 30 with 0 RF UDS: 01/01/2014 Low Risk   Please advise.

## 2014-08-22 ENCOUNTER — Ambulatory Visit: Payer: 59 | Admitting: Internal Medicine

## 2014-08-22 MED ORDER — AMPHETAMINE-DEXTROAMPHET ER 30 MG PO CP24: 30.0000 mg | ORAL_CAPSULE | Freq: Every day | ORAL | Status: DC

## 2014-08-22 MED ORDER — AMPHETAMINE-DEXTROAMPHETAMINE 10 MG PO TABS: 10.0000 mg | ORAL_TABLET | Freq: Every day | ORAL | Status: DC | PRN

## 2014-08-22 NOTE — Telephone Encounter (Signed)
Pt has appt scheduled for 10/2 at 4. Pt wants to know if she can get a enough medication to make it until then, she is almost out of medication.    Please advise.

## 2014-08-22 NOTE — Telephone Encounter (Signed)
Ok , if she has an appointment will RF 30 days

## 2014-08-22 NOTE — Telephone Encounter (Signed)
Needs OV.  

## 2014-08-22 NOTE — Telephone Encounter (Signed)
LMOM for Pt that rx is ready for pick up at front desk.

## 2014-09-05 ENCOUNTER — Ambulatory Visit: Payer: 59 | Admitting: Internal Medicine

## 2014-09-15 ENCOUNTER — Ambulatory Visit: Payer: 59 | Admitting: Internal Medicine

## 2014-10-03 ENCOUNTER — Telehealth: Payer: Self-pay | Admitting: Internal Medicine

## 2014-10-03 MED ORDER — AMPHETAMINE-DEXTROAMPHET ER 30 MG PO CP24: 30.0000 mg | ORAL_CAPSULE | Freq: Every day | ORAL | Status: DC

## 2014-10-03 MED ORDER — AMPHETAMINE-DEXTROAMPHETAMINE 10 MG PO TABS: 10.0000 mg | ORAL_TABLET | Freq: Every day | ORAL | Status: DC | PRN

## 2014-10-03 NOTE — Telephone Encounter (Signed)
Caller name: Ruwayda Relation to pt:  Call back number: [phone] Pharmacy:  Reason for call:  Pt needs a refill on Rx.  Does have med check on 11/10.  She is out now.  amphetamine-dextroamphetamine (ADDERALL XR) 30 MG 24 hr  amphetamine-dextroamphetamine (ADDERALL) 10 MG tablet

## 2014-10-03 NOTE — Telephone Encounter (Signed)
Pt is requesting refill on Adderall 30 MG and 10 MG. Pt states she is completely out.  Last OV: 01/01/2014, appt scheduled for 10/14/2014 Last Fill: 08/22/2014 # 30 0RF on both meds UDS: 01/01/2014 Low risk   Please advise.

## 2014-10-03 NOTE — Telephone Encounter (Signed)
See note from 08/22/2014, will go ahead and refill #30  but she needs a office visit.

## 2014-10-06 NOTE — Telephone Encounter (Signed)
LMOM for Pt informing her medication is ready for pick up at front desk.

## 2014-10-07 ENCOUNTER — Telehealth: Payer: Self-pay

## 2014-10-07 NOTE — Telephone Encounter (Signed)
Connie Lewis called to say that her friend Connie Lewis or her mom may come and pick up her prescription.

## 2014-10-14 ENCOUNTER — Ambulatory Visit: Payer: Self-pay | Admitting: Internal Medicine

## 2014-10-14 ENCOUNTER — Telehealth: Payer: Self-pay | Admitting: Internal Medicine

## 2014-10-14 NOTE — Telephone Encounter (Signed)
Last visit 12/31/2013, 13 phone calls since then, did not keep two recent  appointments, did not show for appointment today. Please start the  dismissal process from this office due to poor compliance with follow-ups

## 2014-10-16 NOTE — Telephone Encounter (Signed)
Called [employer] to initiate prior authorization over the phone. The prior authorization representative, Diane, states she will need to fax [place] the form to fill out. JG//CMA

## 2014-10-16 NOTE — Telephone Encounter (Signed)
Caller name: Connie Lewis, Connie Lewis A Relation to pt: self  Call back number: [phone] Pharmacy: [facility] [phone]  Reason for call:    Pt called to state she never received her amphetamine-dextroamphetamine (ADDERALL XR) 30 MG 24 hr capsule & amphetamine-dextroamphetamine (ADDERALL) 10 MG tablet [[id number]]  Due to the fact her insurance changing. Pt states her [facility] [phone] will fax over prior authorization. (pt states leave a detail message)

## 2014-10-16 NOTE — Telephone Encounter (Signed)
Dismissal process started. Form completed, letter printed, signatures and interoffice to HIM.

## 2014-10-17 NOTE — Telephone Encounter (Signed)
Patient calling back regarding prior auth. Wanting to know if we received fax. Best # [phone]

## 2014-10-17 NOTE — Telephone Encounter (Signed)
Spoke with Janett Billow, awaiting fax from [employer] for PA initiation.

## 2014-10-20 ENCOUNTER — Telehealth: Payer: Self-pay | Admitting: Internal Medicine

## 2014-10-20 NOTE — Telephone Encounter (Signed)
Dismissal Letter sent by [employer] [phone]  Received the Return Receipt showing someone picked up Dismissal 10/24/2014

## 2014-11-03 NOTE — Telephone Encounter (Signed)
Called and requested form for 2nd time. JG//CMA

## 2014-11-06 ENCOUNTER — Telehealth: Payer: Self-pay | Admitting: Internal Medicine

## 2014-11-06 NOTE — Telephone Encounter (Signed)
Please requesting her last refill    amphetamine-dextroamphetamine (ADDERALL XR) 30 MG 24 hr capsule    amphetamine-dextroamphetamine (ADDERALL) 10 MG tablet

## 2014-11-07 ENCOUNTER — Telehealth: Payer: Self-pay | Admitting: *Deleted

## 2014-11-07 MED ORDER — AMPHETAMINE-DEXTROAMPHET ER 30 MG PO CP24: 30.0000 mg | ORAL_CAPSULE | Freq: Every day | ORAL | Status: DC

## 2014-11-07 MED ORDER — AMPHETAMINE-DEXTROAMPHETAMINE 10 MG PO TABS: 10.0000 mg | ORAL_TABLET | Freq: Every day | ORAL | Status: DC | PRN

## 2014-11-07 NOTE — Telephone Encounter (Signed)
If she was dismissed for misuse of meds/contrrolled substances she cannot not have anymore Adderall

## 2014-11-07 NOTE — Telephone Encounter (Signed)
Left message for patient to return the call. Non identifiable voice mail.

## 2014-11-07 NOTE — Telephone Encounter (Signed)
She can have 7 days worth of Adderall then it will be at a month since he dismissed her.

## 2014-11-07 NOTE — Telephone Encounter (Signed)
Adderall 30 mg and 10 mg filled up until 12.20.2015 (date of final dismissal). Refill approved by Dr. Charlett Blake in Dr. Ethel Rana absence. JG//CMA

## 2014-11-07 NOTE — Telephone Encounter (Signed)
Printed and signed by Dr. Charlett Blake in Dr. Larose Kells absence.

## 2014-11-07 NOTE — Telephone Encounter (Signed)
Pt was dismissed due to number of no showed appointments. Please advise.

## 2014-11-07 NOTE — Telephone Encounter (Signed)
Pt is requesting refill on Adderall XR 30 mg and 10 mg. Pt was dismissed by Dr. Larose Kells on 10/14/2014.   Last OV: 12/31/2013 Last Fill on both rx: 10/03/2014 # 30 0RF UDS: 01/01/2014 Low risk  Please advise.

## 2014-11-12 NOTE — Telephone Encounter (Signed)
Spoke with patient and advised of Rx. Patient is asking if you could take her back. She states that she has had a job change and lots of insurance difficulties. She states that she has been seeing you for a long time and doesn't want to lose you.  Please advise.

## 2014-11-13 NOTE — Telephone Encounter (Signed)
LM on voice mail of decision.

## 2014-11-13 NOTE — Telephone Encounter (Signed)
Prescription already taken care of by Dr. Charlett Blake. Unable to reestablish relationship w/  patient at this time

## 2014-12-03 NOTE — Telephone Encounter (Signed)
Pt called to see if she can get prescription for the 10 days her last prescription was short, Adderall 30mg  and 10mg .  Pt also wants to know if there is another provider in our practice who will see her since she has been dismissed.

## 2014-12-04 NOTE — Telephone Encounter (Signed)
Left a message for call back.  

## 2014-12-12 NOTE — Telephone Encounter (Signed)
Pt is no longer a patient here. No further prescription for meds can be given.

## 2014-12-18 NOTE — Telephone Encounter (Signed)
Pt returned call today.  She was made aware that since she is no longer a patient here we are unable to prescribe additional refills or meds.  Pt stated that she did not receive a warning or anything before getting the dismissal letter.  Pt would like to speak with site manager regarding the dismissal process.  Pt was informed that note would be routed to [place] Johnson.

## 2014-12-30 ENCOUNTER — Emergency Department (HOSPITAL_BASED_OUTPATIENT_CLINIC_OR_DEPARTMENT_OTHER)
Admission: EM | Admit: 2014-12-30 | Discharge: 2014-12-30 | Disposition: A | Discharge status: Home / Self Care | Payer: Managed Care, Other (non HMO) | Attending: Emergency Medicine | Admitting: Emergency Medicine

## 2014-12-30 ENCOUNTER — Emergency Department (HOSPITAL_BASED_OUTPATIENT_CLINIC_OR_DEPARTMENT_OTHER): Payer: Managed Care, Other (non HMO)

## 2014-12-30 ENCOUNTER — Encounter (HOSPITAL_BASED_OUTPATIENT_CLINIC_OR_DEPARTMENT_OTHER): Payer: Self-pay | Admitting: *Deleted

## 2014-12-30 DIAGNOSIS — S060X0A Concussion without loss of consciousness, initial encounter: Secondary | ICD-10-CM | POA: Diagnosis not present

## 2014-12-30 DIAGNOSIS — F909 Attention-deficit hyperactivity disorder, unspecified type: Secondary | ICD-10-CM | POA: Diagnosis not present

## 2014-12-30 DIAGNOSIS — Z8719 Personal history of other diseases of the digestive system: Secondary | ICD-10-CM | POA: Insufficient documentation

## 2014-12-30 DIAGNOSIS — S39012A Strain of muscle, fascia and tendon of lower back, initial encounter: Secondary | ICD-10-CM | POA: Diagnosis not present

## 2014-12-30 DIAGNOSIS — Z792 Long term (current) use of antibiotics: Secondary | ICD-10-CM | POA: Insufficient documentation

## 2014-12-30 DIAGNOSIS — S29012A Strain of muscle and tendon of back wall of thorax, initial encounter: Secondary | ICD-10-CM | POA: Diagnosis not present

## 2014-12-30 DIAGNOSIS — Z87891 Personal history of nicotine dependence: Secondary | ICD-10-CM | POA: Diagnosis not present

## 2014-12-30 DIAGNOSIS — S298XXA Other specified injuries of thorax, initial encounter: Secondary | ICD-10-CM

## 2014-12-30 DIAGNOSIS — Z79899 Other long term (current) drug therapy: Secondary | ICD-10-CM | POA: Diagnosis not present

## 2014-12-30 DIAGNOSIS — R52 Pain, unspecified: Secondary | ICD-10-CM

## 2014-12-30 DIAGNOSIS — Y92410 Unspecified street and highway as the place of occurrence of the external cause: Secondary | ICD-10-CM | POA: Insufficient documentation

## 2014-12-30 DIAGNOSIS — S239XXA Sprain of unspecified parts of thorax, initial encounter: Secondary | ICD-10-CM | POA: Diagnosis not present

## 2014-12-30 DIAGNOSIS — S299XXA Unspecified injury of thorax, initial encounter: Secondary | ICD-10-CM | POA: Diagnosis not present

## 2014-12-30 DIAGNOSIS — Z8709 Personal history of other diseases of the respiratory system: Secondary | ICD-10-CM | POA: Insufficient documentation

## 2014-12-30 DIAGNOSIS — S3991XA Unspecified injury of abdomen, initial encounter: Secondary | ICD-10-CM | POA: Insufficient documentation

## 2014-12-30 DIAGNOSIS — Y9389 Activity, other specified: Secondary | ICD-10-CM | POA: Insufficient documentation

## 2014-12-30 DIAGNOSIS — S233XXA Sprain of ligaments of thoracic spine, initial encounter: Secondary | ICD-10-CM

## 2014-12-30 DIAGNOSIS — Y998 Other external cause status: Secondary | ICD-10-CM | POA: Diagnosis not present

## 2014-12-30 DIAGNOSIS — Z3202 Encounter for pregnancy test, result negative: Secondary | ICD-10-CM | POA: Insufficient documentation

## 2014-12-30 DIAGNOSIS — S161XXA Strain of muscle, fascia and tendon at neck level, initial encounter: Secondary | ICD-10-CM | POA: Diagnosis not present

## 2014-12-30 DIAGNOSIS — S199XXA Unspecified injury of neck, initial encounter: Secondary | ICD-10-CM | POA: Diagnosis present

## 2014-12-30 LAB — CBC WITH DIFFERENTIAL/PLATELET
BASOS ABS: 0 10*3/uL (ref 0.0–0.1)
Basophils Relative: 0 % (ref 0–1)
Eosinophils Absolute: 0 10*3/uL (ref 0.0–0.7)
Eosinophils Relative: 0 % (ref 0–5)
HEMATOCRIT: 39.4 % (ref 36.0–46.0)
Hemoglobin: 13.2 g/dL (ref 12.0–15.0)
Lymphocytes Relative: 16 % (ref 12–46)
Lymphs Abs: 2.3 10*3/uL (ref 0.7–4.0)
MCH: 27.9 pg (ref 26.0–34.0)
MCHC: 33.5 g/dL (ref 30.0–36.0)
MCV: 83.3 fL (ref 78.0–100.0)
MONOS PCT: 6 % (ref 3–12)
Monocytes Absolute: 0.8 10*3/uL (ref 0.1–1.0)
NEUTROS PCT: 78 % — AB (ref 43–77)
Neutro Abs: 11.2 10*3/uL — ABNORMAL HIGH (ref 1.7–7.7)
Platelets: 345 10*3/uL (ref 150–400)
RBC: 4.73 MIL/uL (ref 3.87–5.11)
RDW: 12.8 % (ref 11.5–15.5)
WBC: 14.3 10*3/uL — ABNORMAL HIGH (ref 4.0–10.5)

## 2014-12-30 LAB — BASIC METABOLIC PANEL
ANION GAP: 6 (ref 5–15)
BUN: 14 mg/dL (ref 6–23)
CHLORIDE: 107 mmol/L (ref 96–112)
CO2: 23 mmol/L (ref 19–32)
Calcium: 9.1 mg/dL (ref 8.4–10.5)
Creatinine, Ser: 0.76 mg/dL (ref 0.50–1.10)
Glucose, Bld: 99 mg/dL (ref 70–99)
Potassium: 3.2 mmol/L — ABNORMAL LOW (ref 3.5–5.1)
Sodium: 136 mmol/L (ref 135–145)

## 2014-12-30 LAB — HCG, QUANTITATIVE, PREGNANCY

## 2014-12-30 MED ORDER — OXYCODONE-ACETAMINOPHEN 5-325 MG PO TABS: 1.0000 | ORAL_TABLET | ORAL | Status: DC | PRN

## 2014-12-30 MED ORDER — OXYCODONE-ACETAMINOPHEN 5-325 MG PO TABS
1.0000 | ORAL_TABLET | Freq: Once | ORAL | Status: AC
  Administered 2014-12-30: 1 via ORAL
  Filled 2014-12-30: qty 1

## 2014-12-30 MED ORDER — ONDANSETRON HCL 4 MG/2ML IJ SOLN
4.0000 mg | Freq: Once | INTRAMUSCULAR | Status: AC
Start: 2014-12-30 — End: 2014-12-30
  Administered 2014-12-30: 4 mg via INTRAVENOUS
  Filled 2014-12-30: qty 2

## 2014-12-30 MED ORDER — MORPHINE SULFATE 4 MG/ML IJ SOLN
4.0000 mg | Freq: Once | INTRAMUSCULAR | Status: AC
  Administered 2014-12-30: 4 mg via INTRAVENOUS
  Filled 2014-12-30: qty 1
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
## 2014-12-30 NOTE — ED Notes (Signed)
C-collar applied

## 2014-12-30 NOTE — ED Notes (Addendum)
Aspen collar in place per [employer] . NS intact, pms intact s/p placement. Pt ambulates without distress. Respirations easy on labored.

## 2014-12-30 NOTE — Discharge Instructions (Signed)
You have had a head injury which does not appear to require admission at this time. A concussion is a state of changed mental ability from trauma. ° °SEEK IMMEDIATE MEDICAL ATTENTION IF: °There is confusion or drowsiness (although children frequently become drowsy after injury).  °You cannot awaken the injured person.  °There is nausea (feeling sick to your stomach) or continued, forceful vomiting.  °You notice dizziness or unsteadiness which is getting worse, or inability to walk.  °You have convulsions or unconsciousness.  °You experience severe, persistent headaches not relieved by Tylenol. (Do not take aspirin as this impairs clotting abilities). Take other pain medications only as directed.  °You cannot use arms or legs normally.  °There are changes in pupil sizes. (This is the black center in the colored part of the eye)  °There is clear or bloody discharge from the nose or ears.  °Change in speech, vision, swallowing, or understanding.  °Localized weakness, numbness, tingling, or change in bowel or bladder control. ° °You have neck pain, possibly from a cervical strain and/or pinched nerve.  ° °SEEK IMMEDIATE MEDICAL ATTENTION IF: °You develop difficulties swallowing or breathing.  °You have new or worse numbness, weakness, tingling, or movement problems in your arms or legs.  °You develop increasing pain which is uncontrolled with medications.  °You have change in bowel or bladder function, or other concerns. ° ° °

## 2014-12-30 NOTE — ED Notes (Signed)
Patient transported to X-ray 
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
## 2015-01-01 ENCOUNTER — Ambulatory Visit: Payer: Managed Care, Other (non HMO) | Admitting: Family Medicine

## 2015-01-01 ENCOUNTER — Ambulatory Visit (INDEPENDENT_AMBULATORY_CARE_PROVIDER_SITE_OTHER): Payer: Managed Care, Other (non HMO) | Admitting: Family Medicine

## 2015-01-01 ENCOUNTER — Encounter: Payer: Self-pay | Admitting: Family Medicine

## 2015-01-01 VITALS — BP 139/85 | HR 99 | Ht 67.0 in | Wt 170.0 lb

## 2015-01-01 DIAGNOSIS — S199XXA Unspecified injury of neck, initial encounter: Secondary | ICD-10-CM

## 2015-01-01 DIAGNOSIS — S3992XA Unspecified injury of lower back, initial encounter: Secondary | ICD-10-CM

## 2015-01-01 MED ORDER — PREDNISONE (PAK) 10 MG PO TABS: ORAL_TABLET | ORAL | Status: DC

## 2015-01-01 MED ORDER — OXYCODONE-ACETAMINOPHEN 5-325 MG PO TABS: 1.0000 | ORAL_TABLET | Freq: Four times a day (QID) | ORAL | Status: DC | PRN

## 2015-01-01 MED ORDER — CYCLOBENZAPRINE HCL 10 MG PO TABS: 10.0000 mg | ORAL_TABLET | Freq: Three times a day (TID) | ORAL | Status: DC | PRN

## 2015-01-01 NOTE — Patient Instructions (Addendum)
You have cervical radiculopathy (a pinched nerve in the neck), SI joint dysfunction of your low back, and lumbar strain. Prednisone 6 day dose pack to relieve irritation/inflammation of the nerve. Aleve 2 tabs twice a day with food for pain and inflammation - start day AFTER finishing prednisone if prescribed this. Flexeril three times a day as needed for muscle spasms (can make you sleepy - if so do not drive while taking this). Norco or percocet for severe pain (no driving on this medicine). Consider cervical collar if severely painful. Simple range of motion exercises within limits of pain to prevent further stiffness. Consider physical therapy for stretching, exercises, traction, and modalities - check with your insurance about this. Do home stretches hold 20-30 seconds, repeat 3 times at least once a day. Heat 15 minutes at a time 3-4 times a day to help with spasms. Watch head position when on computers, texting, when sleeping in bed - should in line with back to prevent further nerve traction and irritation. Consider home traction unit if you get benefit with this in physical therapy. If not improving we will consider an MRI. Call me in 1-2 weeks for an update on your status - would add physical therapy if improving. Fill out an ROI for records from prior back physician.

## 2015-01-05 DIAGNOSIS — S3992XA Unspecified injury of lower back, initial encounter: Secondary | ICD-10-CM | POA: Insufficient documentation

## 2015-01-05 DIAGNOSIS — S199XXA Unspecified injury of neck, initial encounter: Secondary | ICD-10-CM | POA: Insufficient documentation
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
## 2015-01-07 ENCOUNTER — Telehealth: Payer: Self-pay | Admitting: Family Medicine

## 2015-01-07 NOTE — Telephone Encounter (Signed)
None of the pain medications can be taken at work - including tramadol.  She would have to stick with an anti-inflammatory and tylenol during the day;  Percocet in the evening and bedtime.

## 2015-01-08 ENCOUNTER — Telehealth: Payer: Self-pay | Admitting: *Deleted

## 2015-01-08 NOTE — Telephone Encounter (Signed)
After leaving message on 01-07-15 for patient to call back, spoke to patient on 01-08-15 and gave her the message of what she could take during the day and what to take at night for pain.

## 2015-01-08 NOTE — Telephone Encounter (Signed)
Unable to contact patient on 01-07-15 and LM. Waiting on callback

## 2015-01-09 ENCOUNTER — Telehealth: Payer: Self-pay | Admitting: Family Medicine

## 2015-01-12 ENCOUNTER — Other Ambulatory Visit: Payer: Self-pay | Admitting: *Deleted

## 2015-01-12 MED ORDER — TRAMADOL HCL 50 MG PO TABS: ORAL_TABLET | ORAL | Status: DC

## 2015-01-12 NOTE — Telephone Encounter (Signed)
Prescription called in to [facility] on [address] in [place].

## 2015-01-12 NOTE — Telephone Encounter (Signed)
She is exceeding percocet as prescribed which is dangerous and could be causing damage to her liver.  She can have a one time prescription of tramadol 1 every 8 hours as needed #60 with 0 refills.  She will have to use other non-narcotic measures for pain.

## 2015-01-12 NOTE — Telephone Encounter (Signed)
Patient called Friday and was told to call back on Monday (01-12-15). Spoke with patient today (01-12-15) and she wanted refill on pain medication. Told her that we could not refill the percocet, but probably would step her down to Tramadol 50 mg with approval from physician.

## 2015-01-13 ENCOUNTER — Telehealth: Payer: Self-pay | Admitting: *Deleted

## 2015-01-13 ENCOUNTER — Ambulatory Visit: Payer: Managed Care, Other (non HMO) | Admitting: Family Medicine

## 2015-01-13 NOTE — Telephone Encounter (Signed)
Called patient back and LM to call office. Spoke to patient and advised her that I called in [facility] to the [facility].

## 2015-01-19 ENCOUNTER — Encounter: Payer: Self-pay | Admitting: Family Medicine

## 2015-01-19 ENCOUNTER — Ambulatory Visit (INDEPENDENT_AMBULATORY_CARE_PROVIDER_SITE_OTHER): Payer: Managed Care, Other (non HMO) | Admitting: Family Medicine

## 2015-01-19 VITALS — BP 137/86 | HR 125 | Ht 67.0 in | Wt 170.0 lb

## 2015-01-19 DIAGNOSIS — M5010 Cervical disc disorder with radiculopathy, unspecified cervical region: Secondary | ICD-10-CM

## 2015-01-19 DIAGNOSIS — S3992XD Unspecified injury of lower back, subsequent encounter: Secondary | ICD-10-CM

## 2015-01-19 DIAGNOSIS — S199XXD Unspecified injury of neck, subsequent encounter: Secondary | ICD-10-CM

## 2015-01-19 NOTE — Patient Instructions (Signed)
You have cervical radiculopathy (a pinched nerve in the neck), SI joint dysfunction of your low back, and lumbar strain. Aleve 2 tabs twice a day with food for pain and inflammation. Flexeril three times a day as needed for muscle spasms (can make you sleepy - if so do not drive while taking this). Use the percocet or tramadol for severe pain (no driving on these medicines). Consider cervical collar if severely painful. Simple range of motion exercises within limits of pain to prevent further stiffness. Start physical therapy for stretching, exercises, traction, and modalities. Do home stretches hold 20-30 seconds, repeat 3 times at least once a day. Heat 15 minutes at a time 3-4 times a day to help with spasms. Watch head position when on computers, texting, when sleeping in bed - should in line with back to prevent further nerve traction and irritation. Consider home traction unit if you get benefit with this in physical therapy. If not improving we will consider MRIs. Follow up with me in 1 month.
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
## 2015-01-26 ENCOUNTER — Telehealth: Payer: Self-pay | Admitting: Family Medicine

## 2015-01-26 NOTE — Telephone Encounter (Signed)
Spoke to patient and told her that I would refer her to [facility]. I also told the patient that we would not be refilling pain medication and she would have to use the other medications that was discussed at last office visit.

## 2015-01-26 NOTE — Telephone Encounter (Signed)
There are a couple - [facility] and [facility].  She asked for more pain medication at last office visit last week and I told her we would not be refilling pain medication after last tramadol script.  She will have to use other medications we had discussed.

## 2015-01-26 NOTE — Telephone Encounter (Signed)
Unable to contact patient.    Will try again.

## 2015-02-12 ENCOUNTER — Encounter (HOSPITAL_BASED_OUTPATIENT_CLINIC_OR_DEPARTMENT_OTHER): Payer: Self-pay

## 2015-02-12 ENCOUNTER — Emergency Department (HOSPITAL_BASED_OUTPATIENT_CLINIC_OR_DEPARTMENT_OTHER)
Admission: EM | Admit: 2015-02-12 | Discharge: 2015-02-12 | Disposition: A | Discharge status: Home / Self Care | Payer: Managed Care, Other (non HMO) | Attending: Emergency Medicine | Admitting: Emergency Medicine

## 2015-02-12 DIAGNOSIS — Z87891 Personal history of nicotine dependence: Secondary | ICD-10-CM | POA: Insufficient documentation

## 2015-02-12 DIAGNOSIS — F329 Major depressive disorder, single episode, unspecified: Secondary | ICD-10-CM | POA: Insufficient documentation

## 2015-02-12 DIAGNOSIS — Y9289 Other specified places as the place of occurrence of the external cause: Secondary | ICD-10-CM | POA: Insufficient documentation

## 2015-02-12 DIAGNOSIS — Z79899 Other long term (current) drug therapy: Secondary | ICD-10-CM | POA: Diagnosis not present

## 2015-02-12 DIAGNOSIS — Y998 Other external cause status: Secondary | ICD-10-CM | POA: Insufficient documentation

## 2015-02-12 DIAGNOSIS — Z8719 Personal history of other diseases of the digestive system: Secondary | ICD-10-CM | POA: Diagnosis not present

## 2015-02-12 DIAGNOSIS — Y280XXA Contact with sharp glass, undetermined intent, initial encounter: Secondary | ICD-10-CM | POA: Insufficient documentation

## 2015-02-12 DIAGNOSIS — S91312A Laceration without foreign body, left foot, initial encounter: Secondary | ICD-10-CM | POA: Insufficient documentation

## 2015-02-12 DIAGNOSIS — Y9389 Activity, other specified: Secondary | ICD-10-CM | POA: Diagnosis not present

## 2015-02-12 DIAGNOSIS — G43909 Migraine, unspecified, not intractable, without status migrainosus: Secondary | ICD-10-CM | POA: Diagnosis not present

## 2015-02-12 MED ORDER — LIDOCAINE HCL 2 % IJ SOLN
INTRAMUSCULAR | Status: AC
  Filled 2015-02-12: qty 20

## 2015-02-12 NOTE — ED Provider Notes (Signed)
CSN: [id number]     Arrival date & time 02/12/15  0240 History   First MD Initiated Contact with Patient 02/12/15 0305     Chief Complaint  Patient presents with  . Extremity Laceration     (Consider location/radiation/quality/duration/timing/severity/associated sxs/prior Treatment) HPI Comments: Patient presents for evaluation of left foot laceration. A jar fell off of the refrigerator and shattered. 2 pieces of glass then cut her foot. Her last tetanus shot was 5 years ago.  Patient is a 29 y.o. female presenting with skin laceration. The history is provided by the patient.  Laceration Location:  Foot Foot laceration location:  L foot Depth:  Through dermis Quality: straight   Bleeding: controlled   Time since incident:  1 hour Laceration mechanism:  Broken glass   Past Medical History  Diagnosis Date  . Migraine     dx during pregnancy  . ADHD (attention deficit hyperactivity disorder)   . Allergic rhinitis   . GERD (gastroesophageal reflux disease)   . Depression     dx during her pregnancy in 2000   Past Surgical History  Procedure Laterality Date  . Tonsillectomy    . Wisdom tooth extraction     Family History  Problem Relation Age of Onset  . Diabetes Mother   . Hypertension Mother   . Heart attack      great uncle   . Thyroid disease Father   . Atrial fibrillation Father   . Hypertension Father   . Thyroid disease Sister    History  Substance Use Topics  . Smoking status: Former Smoker    Quit date: 12/11/2009  . Smokeless tobacco: Never Used  . Alcohol Use: 0.0 oz/week    0 Standard drinks or equivalent per week     Comment: socially    OB History    No data available     Review of Systems  All other systems reviewed and are negative.     Allergies  Zithromax  Home Medications   Prior to Admission medications   Medication Sig Start Date End Date Taking? Authorizing Provider  amphetamine-dextroamphetamine (ADDERALL XR) 30 MG 24 hr  capsule Take 30 mg by mouth daily.   Yes Historical Provider, MD  cetirizine (ZYRTEC) 10 MG tablet Take 10 mg by mouth daily.    Historical Provider, MD  cyclobenzaprine (FLEXERIL) 10 MG tablet Take 1 tablet (10 mg total) by mouth every 8 (eight) hours as needed. 01/01/15   Dene Gentry, MD  Multiple Vitamin (MULTIVITAMIN) capsule Take 1 capsule by mouth daily.      Historical Provider, MD  oxyCODONE-acetaminophen (PERCOCET/ROXICET) 5-325 MG per tablet Take 1 tablet by mouth every 6 (six) hours as needed for severe pain. 01/01/15   Dene Gentry, MD  predniSONE (STERAPRED UNI-PAK) 10 MG tablet 6 tabs po day 1, 5 tabs po day 2, 4 tabs po day 3, 3 tabs po day 4, 2 tabs po day 5, 1 tab po day 6 01/01/15   Dene Gentry, MD  traMADol (ULTRAM) 50 MG tablet Take 1 tablet by mouth every 8 hours as needed. 01/12/15   Dene Gentry, MD   BP 133/85 mmHg  Pulse 110  Temp(Src) 98.3 F (36.8 C)  Resp 20  Ht 5\' 7"  (1.702 m)  Wt 175 lb (79.379 kg)  BMI 27.40 kg/m2  SpO2 97%  LMP 02/12/2015 Physical Exam  Constitutional: She is oriented to person, place, and time. She appears well-developed and well-nourished.  HENT:  Head: Normocephalic and atraumatic.  Neck: Normal range of motion. Neck supple.  Neurological: She is alert and oriented to person, place, and time.  Skin: Skin is warm and dry.  The dorsum of the left foot has 2-2.5 cm lacerations oriented perpendicular to the metatarsals. There is no tendon involvement. There is no foreign body present within the wound.  Nursing note and vitals reviewed.   ED Course  Procedures (including critical care time) Labs Review Labs Reviewed - No data to display  Imaging Review No results found.   EKG Interpretation None     LACERATION REPAIR Performed by: Veryl Speak Authorized by: Veryl Speak Consent: Verbal consent obtained. Risks and benefits: risks, benefits and alternatives were discussed Consent given by: patient Patient identity  confirmed: provided demographic data Prepped and Draped in normal sterile fashion Wound explored  Laceration Location: Dorsum of left foot  Laceration Length: 2.5, 2.5 cm  No Foreign Bodies seen or palpated  Anesthesia: local infiltration  Local anesthetic: lidocaine 2 % without epinephrine  Anesthetic total: 2 ml  Irrigation method: syringe Amount of cleaning: standard  Skin closure: 4-0 Prolene   Number of sutures: 3, 3   Technique: Simple interrupted   Patient tolerance: Patient tolerated the procedure well with no immediate complications.   MDM   Final diagnoses:  None    Lacerations repaired. Her tetanus is up-to-date so no tdap given.  Will discharge to home with local wound care and when necessary return.    Veryl Speak, MD 02/12/15 [phone]

## 2015-02-12 NOTE — ED Notes (Signed)
Pt states a glass jar fell out of refrigerater and broke, glass cutting top of lt foot; bandage noted

## 2015-02-12 NOTE — Discharge Instructions (Signed)
Local wound care with bacitracin and dressing changes twice daily.  Sutures are to be removed in 7-10 days. Please follow-up with your primary Dr. for this, and return to the ER if you develop increased redness, pain, pus draining from the wound, or red streaking up or down the leg.   Laceration Care, Adult A laceration is a cut or lesion that goes through all layers of the skin and into the tissue just beneath the skin. TREATMENT  Some lacerations may not require closure. Some lacerations may not be able to be closed due to an increased risk of infection. It is important to see your caregiver as soon as possible after an injury to minimize the risk of infection and maximize the opportunity for successful closure. If closure is appropriate, pain medicines may be given, if needed. The wound will be cleaned to help prevent infection. Your caregiver will use stitches (sutures), staples, wound glue (adhesive), or skin adhesive strips to repair the laceration. These tools bring the skin edges together to allow for faster healing and a better cosmetic outcome. However, all wounds will heal with a scar. Once the wound has healed, scarring can be minimized by covering the wound with sunscreen during the day for 1 full year. HOME CARE INSTRUCTIONS  For sutures or staples:  Keep the wound clean and dry.  If you were given a bandage (dressing), you should change it at least once a day. Also, change the dressing if it becomes wet or dirty, or as directed by your caregiver.  Wash the wound with soap and water 2 times a day. Rinse the wound off with water to remove all soap. Pat the wound dry with a clean towel.  After cleaning, apply a thin layer of the antibiotic ointment as recommended by your caregiver. This will help prevent infection and keep the dressing from sticking.  You may shower as usual after the first 24 hours. Do not soak the wound in water until the sutures are removed.  Only take  over-the-counter or prescription medicines for pain, discomfort, or fever as directed by your caregiver.  Get your sutures or staples removed as directed by your caregiver. For skin adhesive strips:  Keep the wound clean and dry.  Do not get the skin adhesive strips wet. You may bathe carefully, using caution to keep the wound dry.  If the wound gets wet, pat it dry with a clean towel.  Skin adhesive strips will fall off on their own. You may trim the strips as the wound heals. Do not remove skin adhesive strips that are still stuck to the wound. They will fall off in time. For wound adhesive:  You may briefly wet your wound in the shower or bath. Do not soak or scrub the wound. Do not swim. Avoid periods of heavy perspiration until the skin adhesive has fallen off on its own. After showering or bathing, gently pat the wound dry with a clean towel.  Do not apply liquid medicine, cream medicine, or ointment medicine to your wound while the skin adhesive is in place. This may loosen the film before your wound is healed.  If a dressing is placed over the wound, be careful not to apply tape directly over the skin adhesive. This may cause the adhesive to be pulled off before the wound is healed.  Avoid prolonged exposure to sunlight or tanning lamps while the skin adhesive is in place. Exposure to ultraviolet light in the first year will darken the  scar. °· The skin adhesive will usually remain in place for 5 to 10 days, then naturally fall off the skin. Do not pick at the adhesive film. °You may need a tetanus shot if: °· You cannot remember when you had your last tetanus shot. °· You have never had a tetanus shot. °If you get a tetanus shot, your arm may swell, get red, and feel warm to the touch. This is common and not a problem. If you need a tetanus shot and you choose not to have one, there is a rare chance of getting tetanus. Sickness from tetanus can be serious. °SEEK MEDICAL CARE IF:  °· You  have redness, swelling, or increasing pain in the wound. °· You see a red line that goes away from the wound. °· You have yellowish-white fluid (pus) coming from the wound. °· You have a fever. °· You notice a bad smell coming from the wound or dressing. °· Your wound breaks open before or after sutures have been removed. °· You notice something coming out of the wound such as wood or glass. °· Your wound is on your hand or foot and you cannot move a finger or toe. °SEEK IMMEDIATE MEDICAL CARE IF:  °· Your pain is not controlled with prescribed medicine. °· You have severe swelling around the wound causing pain and numbness or a change in color in your arm, hand, leg, or foot. °· Your wound splits open and starts bleeding. °· You have worsening numbness, weakness, or loss of function of any joint around or beyond the wound. °· You develop painful lumps near the wound or on the skin anywhere on your body. °MAKE SURE YOU:  °· Understand these instructions. °· Will watch your condition. °· Will get help right away if you are not doing well or get worse. °Document Released: 11/21/2005 Document Revised: 02/13/2012 Document Reviewed: 05/17/2011 °[employer]® Patient Information ©2015 [employer]. This information is not intended to replace advice given to you by your health care provider. Make sure you discuss any questions you have with your health care provider. ° °

## 2015-02-13 NOTE — ED Notes (Signed)
Pt called and asked for work note to return to work today. Pt does not want to be out past today and is able to perform her work duties.

## 2015-05-01 ENCOUNTER — Inpatient Hospital Stay (HOSPITAL_COMMUNITY): Payer: Managed Care, Other (non HMO)

## 2015-05-01 ENCOUNTER — Encounter (HOSPITAL_COMMUNITY): Payer: Self-pay

## 2015-05-01 ENCOUNTER — Inpatient Hospital Stay (HOSPITAL_COMMUNITY)
Admission: AD | Admit: 2015-05-01 | Discharge: 2015-05-01 | Disposition: A | Discharge status: Home / Self Care | Payer: Managed Care, Other (non HMO) | Source: Ambulatory Visit | Attending: Obstetrics and Gynecology | Admitting: Obstetrics and Gynecology

## 2015-05-01 DIAGNOSIS — R102 Pelvic and perineal pain: Secondary | ICD-10-CM | POA: Diagnosis not present

## 2015-05-01 DIAGNOSIS — Z8249 Family history of ischemic heart disease and other diseases of the circulatory system: Secondary | ICD-10-CM | POA: Diagnosis not present

## 2015-05-01 DIAGNOSIS — Z79899 Other long term (current) drug therapy: Secondary | ICD-10-CM | POA: Diagnosis not present

## 2015-05-01 DIAGNOSIS — N8320 Unspecified ovarian cysts: Secondary | ICD-10-CM

## 2015-05-01 DIAGNOSIS — Z833 Family history of diabetes mellitus: Secondary | ICD-10-CM | POA: Diagnosis not present

## 2015-05-01 DIAGNOSIS — N83201 Unspecified ovarian cyst, right side: Secondary | ICD-10-CM

## 2015-05-01 DIAGNOSIS — K219 Gastro-esophageal reflux disease without esophagitis: Secondary | ICD-10-CM | POA: Diagnosis not present

## 2015-05-01 DIAGNOSIS — J45909 Unspecified asthma, uncomplicated: Secondary | ICD-10-CM | POA: Diagnosis not present

## 2015-05-01 DIAGNOSIS — R1031 Right lower quadrant pain: Secondary | ICD-10-CM

## 2015-05-01 DIAGNOSIS — N939 Abnormal uterine and vaginal bleeding, unspecified: Secondary | ICD-10-CM | POA: Diagnosis present

## 2015-05-01 DIAGNOSIS — R531 Weakness: Secondary | ICD-10-CM | POA: Diagnosis not present

## 2015-05-01 DIAGNOSIS — R112 Nausea with vomiting, unspecified: Secondary | ICD-10-CM | POA: Diagnosis not present

## 2015-05-01 DIAGNOSIS — Z87891 Personal history of nicotine dependence: Secondary | ICD-10-CM | POA: Diagnosis not present

## 2015-05-01 DIAGNOSIS — N8329 Other ovarian cysts: Secondary | ICD-10-CM | POA: Diagnosis not present

## 2015-05-01 HISTORY — DX: Unspecified asthma, uncomplicated: J45.909

## 2015-05-01 LAB — CBC
HEMATOCRIT: 36.4 % (ref 36.0–46.0)
HEMOGLOBIN: 12.4 g/dL (ref 12.0–15.0)
MCH: 28 pg (ref 26.0–34.0)
MCHC: 34.1 g/dL (ref 30.0–36.0)
MCV: 82.2 fL (ref 78.0–100.0)
Platelets: 288 10*3/uL (ref 150–400)
RBC: 4.43 MIL/uL (ref 3.87–5.11)
RDW: 12.7 % (ref 11.5–15.5)
WBC: 10.7 10*3/uL — ABNORMAL HIGH (ref 4.0–10.5)

## 2015-05-01 LAB — GC/CHLAMYDIA PROBE AMP (~~LOC~~) NOT AT ARMC
Chlamydia: NEGATIVE
Neisseria Gonorrhea: NEGATIVE

## 2015-05-01 LAB — URINALYSIS, ROUTINE W REFLEX MICROSCOPIC
Bilirubin Urine: NEGATIVE
Glucose, UA: NEGATIVE mg/dL
Ketones, ur: NEGATIVE mg/dL
LEUKOCYTES UA: NEGATIVE
Nitrite: NEGATIVE
PH: 5.5 (ref 5.0–8.0)
Protein, ur: NEGATIVE mg/dL
Specific Gravity, Urine: 1.01 (ref 1.005–1.030)
Urobilinogen, UA: 0.2 mg/dL (ref 0.0–1.0)

## 2015-05-01 LAB — URINE MICROSCOPIC-ADD ON

## 2015-05-01 LAB — WET PREP, GENITAL
Clue Cells Wet Prep HPF POC: NONE SEEN
Trich, Wet Prep: NONE SEEN
YEAST WET PREP: NONE SEEN

## 2015-05-01 LAB — POCT PREGNANCY, URINE: PREG TEST UR: NEGATIVE

## 2015-05-01 MED ORDER — OXYCODONE-ACETAMINOPHEN 5-325 MG PO TABS
1.0000 | ORAL_TABLET | ORAL | Status: AC
  Administered 2015-05-01: 1 via ORAL
  Filled 2015-05-01: qty 1

## 2015-05-01 MED ORDER — PROMETHAZINE HCL 25 MG PO TABS
25.0000 mg | ORAL_TABLET | Freq: Four times a day (QID) | ORAL | Status: DC | PRN
  Administered 2015-05-01: 25 mg via ORAL
  Filled 2015-05-01: qty 1

## 2015-05-01 MED ORDER — PROMETHAZINE HCL 25 MG PO TABS: 25.0000 mg | ORAL_TABLET | Freq: Four times a day (QID) | ORAL | Status: DC | PRN

## 2015-05-01 MED ORDER — KETOROLAC TROMETHAMINE 60 MG/2ML IM SOLN: 60.0000 mg | Freq: Once | INTRAMUSCULAR | Status: DC

## 2015-05-01 NOTE — Progress Notes (Signed)
Patient given Phenergan as ordered. Patient states she wants to call Dr. Helane Rima before taking pain medication to discuss with her since she has not taken Toradol before. Patient will press call bell if she decides she wants to take Toradol.

## 2015-05-01 NOTE — MAU Provider Note (Signed)
History     CSN: [id number]  Arrival date and time: 05/01/15 1324   First Provider Initiated Contact with Patient 05/01/15 0805      Chief Complaint  Patient presents with  . Vaginal Bleeding  . Nausea   HPI Connie Lewis 29 y.o. [phone] nonpregnant female presents for eval of of right ovarian cyst with pain and heavy bleeding.  4 days ago she was seen by Dr. Helane Rima and an u/s was done.  It showed the cyst.  For the last 3 days she has had increased nausea and vomiting.  Everything she does causes lower abdominal pain.  It is up to 8/10.  Dr. Helane Rima prescribed Percocet but that was completely ineffective one time she used it and the other time she used it, she got very minimal benefit from it. She also prescribed Augmentin for some type of infection, an OCP as well as another rx which pt has not yet received from pharmacy to help with the bleeding.   She called the on call nurse and was told to report here.  She has dry mouth over the last few weeks, one episode of blacking out without falling or passing out.  She feels weak and tired and has not slept all week.   OB History    Gravida Para Term Preterm AB TAB SAB Ectopic Multiple Living   2 1 1  1  1   1       Past Medical History  Diagnosis Date  . Migraine     dx during pregnancy  . ADHD (attention deficit hyperactivity disorder)   . Allergic rhinitis   . GERD (gastroesophageal reflux disease)   . Depression     dx during her pregnancy in 2000  . Asthma     Past Surgical History  Procedure Laterality Date  . Tonsillectomy    . Wisdom tooth extraction    . Tmj arthroplasty  2001    Family History  Problem Relation Age of Onset  . Diabetes Mother   . Hypertension Mother   . Heart attack      great uncle   . Thyroid disease Father   . Atrial fibrillation Father   . Hypertension Father   . Thyroid disease Sister     History  Substance Use Topics  . Smoking status: Former Smoker    Quit date: 12/11/2009  .  Smokeless tobacco: Never Used  . Alcohol Use: 0.0 oz/week    0 Standard drinks or equivalent per week     Comment: socially     Allergies:  Allergies  Allergen Reactions  . Zithromax [Azithromycin Dihydrate] Nausea Only    Prescriptions prior to admission  Medication Sig Dispense Refill Last Dose  . acetaminophen (TYLENOL) 500 MG tablet Take 1,000 mg by mouth every 6 (six) hours as needed for mild pain or headache.   Past Week at Unknown time  . albuterol (PROVENTIL HFA;VENTOLIN HFA) 108 (90 BASE) MCG/ACT inhaler Inhale 1-2 puffs into the lungs every 6 (six) hours as needed for wheezing or shortness of breath.   rescue  . ALPRAZolam (XANAX) 1 MG tablet Take 1 mg by mouth at bedtime as needed for anxiety.   04/30/2015 at 2200  . amoxicillin-clavulanate (AUGMENTIN) 875-125 MG per tablet Take 1 tablet by mouth 2 (two) times daily.   04/30/2015 at Unknown time  . amphetamine-dextroamphetamine (ADDERALL XR) 30 MG 24 hr capsule Take 30 mg by mouth daily.   04/30/2015 at 1800  . amphetamine-dextroamphetamine (  ADDERALL) 20 MG tablet Take 20 mg by mouth daily at 3 pm.   04/30/2015 at Unknown time  . buPROPion (WELLBUTRIN SR) 150 MG 12 hr tablet Take 150 mg by mouth 2 (two) times daily.   04/30/2015 at 1700  . butalbital-acetaminophen-caffeine (FIORICET, ESGIC) 50-325-40 MG per tablet Take 1 tablet by mouth 2 (two) times daily as needed for headache.   04/30/2015 at Unknown time  . cetirizine (ZYRTEC) 10 MG tablet Take 10 mg by mouth daily.   Past Week at Unknown time  . ibuprofen (ADVIL,MOTRIN) 200 MG tablet Take 800 mg by mouth every 6 (six) hours as needed for headache or moderate pain.   Past Week at Unknown time  . ketotifen (ZADITOR) 0.025 % ophthalmic solution Place 1 drop into both eyes 2 (two) times daily as needed (for allergies).   Past Week at Unknown time  . Multiple Vitamin (MULTIVITAMIN) capsule Take 1 capsule by mouth daily.     04/30/2015 at Unknown time  . norethindrone-ethinyl estradiol  (OVCON-35,BALZIVA,BRIELLYN) 0.4-35 MG-MCG tablet Take 1 tablet by mouth daily.   04/30/2015 at Unknown time  . oxyCODONE-acetaminophen (PERCOCET) 10-325 MG per tablet Take 1 tablet by mouth every 4 (four) hours as needed for pain.   05/01/2015 at 0400  . cyclobenzaprine (FLEXERIL) 10 MG tablet Take 1 tablet (10 mg total) by mouth every 8 (eight) hours as needed. (Patient not taking: Reported on 05/01/2015) 60 tablet 1   . oxyCODONE-acetaminophen (PERCOCET/ROXICET) 5-325 MG per tablet Take 1 tablet by mouth every 6 (six) hours as needed for severe pain. (Patient not taking: Reported on 05/01/2015) 60 tablet 0   . predniSONE (STERAPRED UNI-PAK) 10 MG tablet 6 tabs po day 1, 5 tabs po day 2, 4 tabs po day 3, 3 tabs po day 4, 2 tabs po day 5, 1 tab po day 6 (Patient not taking: Reported on 05/01/2015) 21 tablet 0   . traMADol (ULTRAM) 50 MG tablet Take 1 tablet by mouth every 8 hours as needed. (Patient not taking: Reported on 05/01/2015) 60 tablet 0     ROS Pertinent ROS in HPI.  All other systems are negative.   Physical Exam   Blood pressure 121/78, pulse 101, temperature 98.6 F (37 C), temperature source Oral, resp. rate 16, height 5\' 7"  (1.702 m), weight 207 lb (93.895 kg), SpO2 100 %.  Physical Exam  Constitutional: She is oriented to person, place, and time. She appears well-developed and well-nourished. No distress.  HENT:  Head: Normocephalic and atraumatic.  Eyes: EOM are normal.  Neck: Normal range of motion.  Cardiovascular: Normal rate and regular rhythm.   Respiratory: Effort normal and breath sounds normal. No respiratory distress.  GI: Soft. Bowel sounds are normal. She exhibits no distension. There is tenderness. There is guarding. There is no rebound.  Genitourinary:  Small to mod amt of dark red blood in vagina Pt is tender to entire exam - without significant increase with CMT or one side adnexal tenderness greater than the other.  Musculoskeletal: Normal range of motion.   Neurological: She is alert and oriented to person, place, and time.  Skin: Skin is warm and dry.  Psychiatric: She has a normal mood and affect.    MAU Course  Procedures  MDM Phenergan ordered to address her nausea and vomiting.  She initially declines this stating that all nausea medicines cause migraines for her but later agreed to using this medication after being informed that it generally helps with migraines and causes  sleepiness.   CBC ordered to investigate the reported heavy bleeding.  HIV declined.   When pt exam revealed significant tenderness, it was decided to obtain pelvic u/s to eval for possible torsion and toradol IM to help her with u/s.  She was initially agreeable but soon after informed the nurse she wanted to personally check with Dr. Helane Rima to insure this was an appropriate medication.  It is uncertain if pt spoke with Dr. Helane Rima.   Dr. Matthew Saras and Dr. Helane Rima have agreed pt may have percocet if desired although no concern with Toradol either.  Dr. Helane Rima notes she has seen pt several times for same complaint already.  Will proceed with u/s. U/S shows no torsion.  Cyst is 3.4 cm which is consistent with reported results in office earlier this week.   Reviewed results over phone with Dr. Matthew Saras.  He is agreeable to give an additional percocet to help with pain OR Toradol injection OR both.  He is agreeable discharge with rx for phenergan and f/u in clinic early next week.  Pt again offered Toradol injection as percocet has been minimally effective.  Again she declines, this time stating that she thinks it is not well-covered by insurance.   Pt ready for discharge, asking for note for work to be out until appt on Tuesday with Dr. Helane Rima.  This is provided.  Pt asking for multiple copies so that her family can take care of her.   Assessment and Plan  A:  1. Cyst of right ovary   2. Combined abdominal and pelvic pain, right   3. Nausea and vomiting, vomiting of  unspecified type    P: Discharge to home Rx provided for Phenergan Call today to sched f/u in office for Tuesday Work note provided as requested Patient may return to MAU as needed or if her condition were to change or worsen    Paticia Stack 05/01/2015, 8:06 AM

## 2015-05-01 NOTE — Discharge Instructions (Signed)
Ovarian Cyst An ovarian cyst is a fluid-filled sac that forms on an ovary. The ovaries are small organs that produce eggs in women. Various types of cysts can form on the ovaries. Most are not cancerous. Many do not cause problems, and they often go away on their own. Some may cause symptoms and require treatment. Common types of ovarian cysts include:  Functional cysts--These cysts may occur every month during the menstrual cycle. This is normal. The cysts usually go away with the next menstrual cycle if the woman does not get pregnant. Usually, there are no symptoms with a functional cyst.  Endometrioma cysts--These cysts form from the tissue that lines the uterus. They are also called "chocolate cysts" because they become filled with blood that turns brown. This type of cyst can cause pain in the lower abdomen during intercourse and with your menstrual period.  Cystadenoma cysts--This type develops from the cells on the outside of the ovary. These cysts can get very big and cause lower abdomen pain and pain with intercourse. This type of cyst can twist on itself, cut off its blood supply, and cause severe pain. It can also easily rupture and cause a lot of pain.  Dermoid cysts--This type of cyst is sometimes found in both ovaries. These cysts may contain different kinds of body tissue, such as skin, teeth, hair, or cartilage. They usually do not cause symptoms unless they get very big.  Theca lutein cysts--These cysts occur when too much of a certain hormone (human chorionic gonadotropin) is produced and overstimulates the ovaries to produce an egg. This is most common after procedures used to assist with the conception of a baby (in vitro fertilization). CAUSES   Fertility drugs can cause a condition in which multiple large cysts are formed on the ovaries. This is called ovarian hyperstimulation syndrome.  A condition called polycystic ovary syndrome can cause hormonal imbalances that can lead to  nonfunctional ovarian cysts. SIGNS AND SYMPTOMS  Many ovarian cysts do not cause symptoms. If symptoms are present, they may include:  Pelvic pain or pressure.  Pain in the lower abdomen.  Pain during sexual intercourse.  Increasing girth (swelling) of the abdomen.  Abnormal menstrual periods.  Increasing pain with menstrual periods.  Stopping having menstrual periods without being pregnant. DIAGNOSIS  These cysts are commonly found during a routine or annual pelvic exam. Tests may be ordered to find out more about the cyst. These tests may include:  Ultrasound.  X-ray of the pelvis.  CT scan.  MRI.  Blood tests. TREATMENT  Many ovarian cysts go away on their own without treatment. Your health care provider may want to check your cyst regularly for 2-3 months to see if it changes. For women in menopause, it is particularly important to monitor a cyst closely because of the higher rate of ovarian cancer in menopausal women. When treatment is needed, it may include any of the following:  A procedure to drain the cyst (aspiration). This may be done using a long needle and ultrasound. It can also be done through a laparoscopic procedure. This involves using a thin, lighted tube with a tiny camera on the end (laparoscope) inserted through a small incision.  Surgery to remove the whole cyst. This may be done using laparoscopic surgery or an open surgery involving a larger incision in the lower abdomen.  Hormone treatment or birth control pills. These methods are sometimes used to help dissolve a cyst. HOME CARE INSTRUCTIONS   Only take over-the-counter   or prescription medicines as directed by your health care provider.  Follow up with your health care provider as directed.  Get regular pelvic exams and Pap tests. SEEK MEDICAL CARE IF:   Your periods are late, irregular, or painful, or they stop.  Your pelvic pain or abdominal pain does not go away.  Your abdomen becomes  larger or swollen.  You have pressure on your bladder or trouble emptying your bladder completely.  You have pain during sexual intercourse.  You have feelings of fullness, pressure, or discomfort in your stomach.  You lose weight for no apparent reason.  You feel generally ill.  You become constipated.  You lose your appetite.  You develop acne.  You have an increase in body and facial hair.  You are gaining weight, without changing your exercise and eating habits.  You think you are pregnant. SEEK IMMEDIATE MEDICAL CARE IF:   You have increasing abdominal pain.  You feel sick to your stomach (nauseous), and you throw up (vomit).  You develop a fever that comes on suddenly.  You have abdominal pain during a bowel movement.  Your menstrual periods become heavier than usual. MAKE SURE YOU:  Understand these instructions.  Will watch your condition.  Will get help right away if you are not doing well or get worse. Document Released: 11/21/2005 Document Revised: 11/26/2013 Document Reviewed: 07/29/2013 [employer] Patient Information 2015 [employer]. This information is not intended to replace advice given to you by your health care provider. Make sure you discuss any questions you have with your health care provider.  

## 2015-05-01 NOTE — MAU Note (Signed)
Pt states she was seen in MD office on Tuesday for RLQ pain and prolonged bleeding. Bleeding is heavy at times and then it is spotting. U/s in office on Tuesday dx ovarian cyst on right side. Nausea and vomiting and severe pain.

## 2015-12-28 ENCOUNTER — Emergency Department (HOSPITAL_BASED_OUTPATIENT_CLINIC_OR_DEPARTMENT_OTHER)
Admission: EM | Admit: 2015-12-28 | Discharge: 2015-12-28 | Disposition: A | Discharge status: Home / Self Care | Payer: Managed Care, Other (non HMO) | Attending: Emergency Medicine | Admitting: Emergency Medicine

## 2015-12-28 ENCOUNTER — Encounter (HOSPITAL_BASED_OUTPATIENT_CLINIC_OR_DEPARTMENT_OTHER): Payer: Self-pay

## 2015-12-28 DIAGNOSIS — Z793 Long term (current) use of hormonal contraceptives: Secondary | ICD-10-CM | POA: Insufficient documentation

## 2015-12-28 DIAGNOSIS — Z87891 Personal history of nicotine dependence: Secondary | ICD-10-CM | POA: Diagnosis not present

## 2015-12-28 DIAGNOSIS — R112 Nausea with vomiting, unspecified: Secondary | ICD-10-CM | POA: Diagnosis not present

## 2015-12-28 DIAGNOSIS — Z79899 Other long term (current) drug therapy: Secondary | ICD-10-CM | POA: Diagnosis not present

## 2015-12-28 DIAGNOSIS — R1013 Epigastric pain: Secondary | ICD-10-CM | POA: Diagnosis not present

## 2015-12-28 DIAGNOSIS — Z862 Personal history of diseases of the blood and blood-forming organs and certain disorders involving the immune mechanism: Secondary | ICD-10-CM | POA: Insufficient documentation

## 2015-12-28 DIAGNOSIS — Z8719 Personal history of other diseases of the digestive system: Secondary | ICD-10-CM | POA: Insufficient documentation

## 2015-12-28 DIAGNOSIS — R51 Headache: Secondary | ICD-10-CM | POA: Diagnosis not present

## 2015-12-28 DIAGNOSIS — Z3202 Encounter for pregnancy test, result negative: Secondary | ICD-10-CM | POA: Insufficient documentation

## 2015-12-28 DIAGNOSIS — R000 Tachycardia, unspecified: Secondary | ICD-10-CM | POA: Insufficient documentation

## 2015-12-28 DIAGNOSIS — F909 Attention-deficit hyperactivity disorder, unspecified type: Secondary | ICD-10-CM | POA: Insufficient documentation

## 2015-12-28 DIAGNOSIS — J45909 Unspecified asthma, uncomplicated: Secondary | ICD-10-CM | POA: Insufficient documentation

## 2015-12-28 DIAGNOSIS — Z8679 Personal history of other diseases of the circulatory system: Secondary | ICD-10-CM | POA: Diagnosis not present

## 2015-12-28 DIAGNOSIS — R519 Headache, unspecified: Secondary | ICD-10-CM

## 2015-12-28 HISTORY — DX: Anemia, unspecified: D64.9

## 2015-12-28 LAB — COMPREHENSIVE METABOLIC PANEL
ALK PHOS: 79 U/L (ref 38–126)
ALT: 28 U/L (ref 14–54)
ANION GAP: 9 (ref 5–15)
AST: 24 U/L (ref 15–41)
Albumin: 4.5 g/dL (ref 3.5–5.0)
BUN: 11 mg/dL (ref 6–20)
CO2: 28 mmol/L (ref 22–32)
Calcium: 9.5 mg/dL (ref 8.9–10.3)
Chloride: 102 mmol/L (ref 101–111)
Creatinine, Ser: 0.66 mg/dL (ref 0.44–1.00)
GFR calc Af Amer: >60 mL/min (ref >60)
GFR calc non Af Amer: >60 mL/min (ref >60)
GLUCOSE: 93 mg/dL (ref 65–99)
POTASSIUM: 3.3 mmol/L — AB (ref 3.5–5.1)
SODIUM: 139 mmol/L (ref 135–145)
Total Bilirubin: 0.4 mg/dL (ref 0.3–1.2)
Total Protein: 7.6 g/dL (ref 6.5–8.1)

## 2015-12-28 LAB — URINALYSIS, ROUTINE W REFLEX MICROSCOPIC
Glucose, UA: NEGATIVE mg/dL
KETONES UR: 15 mg/dL — AB
Leukocytes, UA: NEGATIVE
NITRITE: NEGATIVE
Protein, ur: NEGATIVE mg/dL
Specific Gravity, Urine: 1.027 (ref 1.005–1.030)
pH: 6 (ref 5.0–8.0)

## 2015-12-28 LAB — URINE MICROSCOPIC-ADD ON

## 2015-12-28 LAB — CBC WITH DIFFERENTIAL/PLATELET
Basophils Absolute: 0 10*3/uL (ref 0.0–0.1)
Basophils Relative: 0 %
EOS ABS: 0.1 10*3/uL (ref 0.0–0.7)
Eosinophils Relative: 1 %
HCT: 38.4 % (ref 36.0–46.0)
Hemoglobin: 12.7 g/dL (ref 12.0–15.0)
Lymphocytes Relative: 26 %
Lymphs Abs: 2.9 10*3/uL (ref 0.7–4.0)
MCH: 27.1 pg (ref 26.0–34.0)
MCHC: 33.1 g/dL (ref 30.0–36.0)
MCV: 81.9 fL (ref 78.0–100.0)
Monocytes Absolute: 0.8 10*3/uL (ref 0.1–1.0)
Monocytes Relative: 7 %
Neutro Abs: 7.5 10*3/uL (ref 1.7–7.7)
Neutrophils Relative %: 66 %
Platelets: 333 10*3/uL (ref 150–400)
RBC: 4.69 MIL/uL (ref 3.87–5.11)
RDW: 12.5 % (ref 11.5–15.5)
WBC: 11.2 10*3/uL — AB (ref 4.0–10.5)

## 2015-12-28 LAB — PREGNANCY, URINE: Preg Test, Ur: NEGATIVE

## 2015-12-28 LAB — LIPASE, BLOOD: Lipase: 13 U/L (ref 11–51)

## 2015-12-28 MED ORDER — SODIUM CHLORIDE 0.9 % IV BOLUS (SEPSIS)
1000.0000 mL | Freq: Once | INTRAVENOUS | Status: AC
  Administered 2015-12-28: 1000 mL via INTRAVENOUS

## 2015-12-28 MED ORDER — DIPHENHYDRAMINE HCL 50 MG/ML IJ SOLN
25.0000 mg | Freq: Once | INTRAMUSCULAR | Status: AC
  Administered 2015-12-28: 25 mg via INTRAVENOUS
  Filled 2015-12-28: qty 1

## 2015-12-28 MED ORDER — PROCHLORPERAZINE EDISYLATE 5 MG/ML IJ SOLN
10.0000 mg | Freq: Once | INTRAMUSCULAR | Status: AC
  Administered 2015-12-28: 10 mg via INTRAVENOUS
  Filled 2015-12-28: qty 2

## 2015-12-28 MED ORDER — ONDANSETRON HCL 4 MG PO TABS: 4.0000 mg | ORAL_TABLET | Freq: Four times a day (QID) | ORAL | Status: DC

## 2015-12-28 MED ORDER — GI COCKTAIL ~~LOC~~
30.0000 mL | Freq: Once | ORAL | Status: AC
  Administered 2015-12-28: 30 mL via ORAL
  Filled 2015-12-28: qty 30

## 2015-12-28 NOTE — Discharge Instructions (Signed)
Try zantac at home.  Return for persistent  vomiting or sudden worsening abdominal pain.  Nausea and Vomiting Nausea is a sick feeling that often comes before throwing up (vomiting). Vomiting is a reflex where stomach contents come out of your mouth. Vomiting can cause severe loss of body fluids (dehydration). Children and elderly adults can become dehydrated quickly, especially if they also have diarrhea. Nausea and vomiting are symptoms of a condition or disease. It is important to find the cause of your symptoms. CAUSES   Direct irritation of the stomach lining. This irritation can result from increased acid production (gastroesophageal reflux disease), infection, food poisoning, taking certain medicines (such as nonsteroidal anti-inflammatory drugs), alcohol use, or tobacco use.  Signals from the brain.These signals could be caused by a headache, heat exposure, an inner ear disturbance, increased pressure in the brain from injury, infection, a tumor, or a concussion, pain, emotional stimulus, or metabolic problems.  An obstruction in the gastrointestinal tract (bowel obstruction).  Illnesses such as diabetes, hepatitis, gallbladder problems, appendicitis, kidney problems, cancer, sepsis, atypical symptoms of a heart attack, or eating disorders.  Medical treatments such as chemotherapy and radiation.  Receiving medicine that makes you sleep (general anesthetic) during surgery. DIAGNOSIS Your caregiver may ask for tests to be done if the problems do not improve after a few days. Tests may also be done if symptoms are severe or if the reason for the nausea and vomiting is not clear. Tests may include:  Urine tests.  Blood tests.  Stool tests.  Cultures (to look for evidence of infection).  X-rays or other imaging studies. Test results can help your caregiver make decisions about treatment or the need for additional tests. TREATMENT You need to stay well hydrated. Drink frequently but  in small amounts.You may wish to drink water, sports drinks, clear broth, or eat frozen ice pops or gelatin dessert to help stay hydrated.When you eat, eating slowly may help prevent nausea.There are also some antinausea medicines that may help prevent nausea. HOME CARE INSTRUCTIONS   Take all medicine as directed by your caregiver.  If you do not have an appetite, do not force yourself to eat. However, you must continue to drink fluids.  If you have an appetite, eat a normal diet unless your caregiver tells you differently.  Eat a variety of complex carbohydrates (rice, wheat, potatoes, bread), lean meats, yogurt, fruits, and vegetables.  Avoid high-fat foods because they are more difficult to digest.  Drink enough water and fluids to keep your urine clear or pale yellow.  If you are dehydrated, ask your caregiver for specific rehydration instructions. Signs of dehydration may include:  Severe thirst.  Dry lips and mouth.  Dizziness.  Dark urine.  Decreasing urine frequency and amount.  Confusion.  Rapid breathing or pulse. SEEK IMMEDIATE MEDICAL CARE IF:   You have blood or brown flecks (like coffee grounds) in your vomit.  You have black or bloody stools.  You have a severe headache or stiff neck.  You are confused.  You have severe abdominal pain.  You have chest pain or trouble breathing.  You do not urinate at least once every 8 hours.  You develop cold or clammy skin.  You continue to vomit for longer than 24 to 48 hours.  You have a fever. MAKE SURE YOU:   Understand these instructions.  Will watch your condition.  Will get help right away if you are not doing well or get worse.   This information  is not intended to replace advice given to you by your health care provider. Make sure you discuss any questions you have with your health care provider.   Document Released: 11/21/2005 Document Revised: 02/13/2012 Document Reviewed:  04/20/2011 Elsevier Interactive Patient Education [employer].

## 2015-12-28 NOTE — ED Provider Notes (Signed)
CSN: [id number]     Arrival date & time 12/28/15  1118 History   First MD Initiated Contact with Patient 12/28/15 1240     Chief Complaint  Patient presents with  . Abdominal Pain     (Consider location/radiation/quality/duration/timing/severity/associated sxs/prior Treatment) Patient is a 30 y.o. female presenting with abdominal pain.  Abdominal Pain Pain location:  Epigastric Pain quality: sharp and shooting   Pain radiates to:  Does not radiate Pain severity:  Severe Onset quality:  Sudden Duration:  1 day Timing:  Constant Progression:  Resolved Chronicity:  New Relieved by:  Nothing Worsened by:  Nothing tried Ineffective treatments:  None tried Associated symptoms: nausea and vomiting   Associated symptoms: no chest pain, no chills, no dysuria, no fever and no shortness of breath     30 yo F with chief complaints of vomiting. Patient states that she had some significant abdominal pain yesterday that spontaneously resolved overnight. Today she had multiple episodes of vomiting including a large amount of blood initially that is tapered off. Patient is no longer having abdominal pain. States she has had a migraine headache as well for the past 3 days. This feels similar to her prior migraines which feels like is worse in intensity. Denies fevers or chills denies diarrhea denies dysuria or increased frequency.  Past Medical History  Diagnosis Date  . Migraine     dx during pregnancy  . ADHD (attention deficit hyperactivity disorder)   . Allergic rhinitis   . GERD (gastroesophageal reflux disease)   . Depression     dx during her pregnancy in 2000  . Asthma   . Anemia    Past Surgical History  Procedure Laterality Date  . Tonsillectomy    . Wisdom tooth extraction    . Tmj arthroplasty  2001   Family History  Problem Relation Age of Onset  . Diabetes Mother   . Hypertension Mother   . Heart attack      great uncle   . Thyroid disease Father   . Atrial  fibrillation Father   . Hypertension Father   . Thyroid disease Sister    Social History  Substance Use Topics  . Smoking status: Former Smoker    Quit date: 12/11/2009  . Smokeless tobacco: Never Used  . Alcohol Use: 0.0 oz/week    0 Standard drinks or equivalent per week     Comment: socially    OB History    Gravida Para Term Preterm AB TAB SAB Ectopic Multiple Living   2 1 1  1  1   1      Review of Systems  Constitutional: Negative for fever and chills.  HENT: Negative for congestion and rhinorrhea.   Eyes: Negative for redness and visual disturbance.  Respiratory: Negative for shortness of breath and wheezing.   Cardiovascular: Negative for chest pain and palpitations.  Gastrointestinal: Positive for nausea, vomiting and abdominal pain.  Genitourinary: Negative for dysuria and urgency.  Musculoskeletal: Negative for myalgias and arthralgias.  Skin: Negative for pallor and wound.  Neurological: Negative for dizziness and headaches.      Allergies  Zithromax  Home Medications   Prior to Admission medications   Medication Sig Start Date End Date Taking? Authorizing Provider  clonazePAM (KLONOPIN) 1 MG tablet Take 1 mg by mouth 2 (two) times daily.   Yes Historical Provider, MD  albuterol (PROVENTIL HFA;VENTOLIN HFA) 108 (90 BASE) MCG/ACT inhaler Inhale 1-2 puffs into the lungs every 6 (six) hours as needed  for wheezing or shortness of breath.    Historical Provider, MD  amphetamine-dextroamphetamine (ADDERALL XR) 30 MG 24 hr capsule Take 30 mg by mouth daily.    Historical Provider, MD  amphetamine-dextroamphetamine (ADDERALL) 20 MG tablet Take 20 mg by mouth daily at 3 pm.    Historical Provider, MD  buPROPion (WELLBUTRIN SR) 150 MG 12 hr tablet Take 150 mg by mouth 2 (two) times daily.    Historical Provider, MD  cetirizine (ZYRTEC) 10 MG tablet Take 10 mg by mouth daily.    Historical Provider, MD  ibuprofen (ADVIL,MOTRIN) 200 MG tablet Take 800 mg by mouth every 6  (six) hours as needed for headache or moderate pain.    Historical Provider, MD  Multiple Vitamin (MULTIVITAMIN) capsule Take 1 capsule by mouth daily.      Historical Provider, MD  norethindrone-ethinyl estradiol (OVCON-35,BALZIVA,BRIELLYN) 0.4-35 MG-MCG tablet Take 1 tablet by mouth daily.    Historical Provider, MD  ondansetron (ZOFRAN) 4 MG tablet Take 1 tablet (4 mg total) by mouth every 6 (six) hours. 12/28/15   Deno Etienne, DO   BP 102/65 mmHg  Pulse 70  Temp(Src) 97.7 F (36.5 C) (Oral)  Resp 18  Ht 5\' 7"  (1.702 m)  Wt 170 lb (77.111 kg)  BMI 26.62 kg/m2  SpO2 99%  LMP 12/14/2015 Physical Exam  Constitutional: She is oriented to person, place, and time. She appears well-developed and well-nourished. No distress.  HENT:  Head: Normocephalic and atraumatic.  Eyes: EOM are normal. Pupils are equal, round, and reactive to light.  Neck: Normal range of motion. Neck supple.  Cardiovascular: Regular rhythm.  Tachycardia present.  Exam reveals no gallop and no friction rub.   No murmur heard. Pulmonary/Chest: Effort normal. She has no wheezes. She has no rales.  Abdominal: Soft. She exhibits no distension. There is no tenderness. There is no rebound and no guarding.  Musculoskeletal: She exhibits no edema or tenderness.  Neurological: She is alert and oriented to person, place, and time.  Skin: Skin is warm and dry. She is not diaphoretic.  Psychiatric: She has a normal mood and affect. Her behavior is normal.  Nursing note and vitals reviewed.   ED Course  Procedures (including critical care time) Labs Review Labs Reviewed  URINALYSIS, ROUTINE W REFLEX MICROSCOPIC (NOT AT [facility]) - Abnormal; Notable for the following:    Color, Urine AMBER (*)    APPearance CLOUDY (*)    Hgb urine dipstick SMALL (*)    Bilirubin Urine SMALL (*)    Ketones, ur 15 (*)    All other components within normal limits  CBC WITH DIFFERENTIAL/PLATELET - Abnormal; Notable for the following:    WBC 11.2  (*)    All other components within normal limits  COMPREHENSIVE METABOLIC PANEL - Abnormal; Notable for the following:    Potassium 3.3 (*)    All other components within normal limits  URINE MICROSCOPIC-ADD ON - Abnormal; Notable for the following:    Squamous Epithelial / LPF 6-30 (*)    Bacteria, UA MANY (*)    All other components within normal limits  PREGNANCY, URINE  LIPASE, BLOOD    Imaging Review No results found. I have personally reviewed and evaluated these images and lab results as part of my medical decision-making.   EKG Interpretation None      MDM   Final diagnoses:  Non-intractable vomiting with nausea, vomiting of unspecified type  Acute nonintractable headache, unspecified headache type    30 yo  F with a chief complaints of bloody emesis. This has resolved prior to arrival here. Patient has had multiple episodes of vomiting today. Has never thrown up blood before and so is worried about that. States that she does take a large amount of ibuprofen. Denies any other blood thinning medicines. Denies likelihood of being pregnant.  Patient with noted abdominal pain on my exam. Mild tachycardia. Will give the patient a fluid bolus GI cocktail and Zofran. Check a CBC CMP lipase. Suspect patient's symptoms consistent with a Mallory-Weiss. No continued bleeding.  Patient's hemoglobin is above her baseline. Labs are otherwise unremarkable. Heart rate significantly improved after IV fluid bolus. Headache almost completely resolved after migraine cocktail. Will discharge patient home.  3:35 PM:  I have discussed the diagnosis/risks/treatment options with the patient and believe the pt to be eligible for discharge home to follow-up with PCP. We also discussed returning to the ED immediately if new or worsening sx occur. We discussed the sx which are most concerning (e.g., sudden worsening pain, fever, inability to tolerate by mouth) that necessitate immediate return.  Medications administered to the patient during their visit and any new prescriptions provided to the patient are listed below.  Medications given during this visit Medications  sodium chloride 0.9 % bolus 1,000 mL (0 mLs Intravenous Stopped 12/28/15 1443)  prochlorperazine (COMPAZINE) injection 10 mg (10 mg Intravenous Given 12/28/15 1329)  diphenhydrAMINE (BENADRYL) injection 25 mg (25 mg Intravenous Given 12/28/15 1329)  gi cocktail (Maalox,Lidocaine,Donnatal) (30 mLs Oral Given 12/28/15 1330)    Discharge Medication List as of 12/28/2015  2:18 PM    START taking these medications   Details  ondansetron (ZOFRAN) 4 MG tablet Take 1 tablet (4 mg total) by mouth every 6 (six) hours., Starting 12/28/2015, Until Discontinued, Print        The patient appears reasonably screen and/or stabilized for discharge and I doubt any other medical condition or other [facility] requiring further screening, evaluation, or treatment in the ED at this time prior to discharge.    Deno Etienne, DO 12/28/15 1535

## 2015-12-28 NOTE — ED Notes (Signed)
C/o abd pain, nausea started yesterday-this am pt states toilet was full of bright red blood x 1-small amount blood second time-no blood 3rd emesis-NAD
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
## 2017-03-03 ENCOUNTER — Encounter (HOSPITAL_COMMUNITY): Payer: Self-pay

## 2017-03-03 ENCOUNTER — Inpatient Hospital Stay (HOSPITAL_COMMUNITY)
Admission: AD | Admit: 2017-03-03 | Discharge: 2017-03-03 | Disposition: A | Discharge status: Home / Self Care | Payer: BLUE CROSS/BLUE SHIELD | Source: Ambulatory Visit | Attending: Obstetrics and Gynecology | Admitting: Obstetrics and Gynecology

## 2017-03-03 ENCOUNTER — Inpatient Hospital Stay (HOSPITAL_COMMUNITY): Payer: BLUE CROSS/BLUE SHIELD

## 2017-03-03 DIAGNOSIS — K219 Gastro-esophageal reflux disease without esophagitis: Secondary | ICD-10-CM | POA: Diagnosis not present

## 2017-03-03 DIAGNOSIS — Z87891 Personal history of nicotine dependence: Secondary | ICD-10-CM | POA: Diagnosis not present

## 2017-03-03 DIAGNOSIS — Z79899 Other long term (current) drug therapy: Secondary | ICD-10-CM | POA: Insufficient documentation

## 2017-03-03 DIAGNOSIS — F909 Attention-deficit hyperactivity disorder, unspecified type: Secondary | ICD-10-CM | POA: Diagnosis not present

## 2017-03-03 DIAGNOSIS — N946 Dysmenorrhea, unspecified: Secondary | ICD-10-CM | POA: Diagnosis not present

## 2017-03-03 DIAGNOSIS — N920 Excessive and frequent menstruation with regular cycle: Secondary | ICD-10-CM | POA: Insufficient documentation

## 2017-03-03 DIAGNOSIS — F329 Major depressive disorder, single episode, unspecified: Secondary | ICD-10-CM | POA: Insufficient documentation

## 2017-03-03 DIAGNOSIS — Z833 Family history of diabetes mellitus: Secondary | ICD-10-CM | POA: Insufficient documentation

## 2017-03-03 DIAGNOSIS — Z9889 Other specified postprocedural states: Secondary | ICD-10-CM | POA: Insufficient documentation

## 2017-03-03 DIAGNOSIS — R1031 Right lower quadrant pain: Secondary | ICD-10-CM | POA: Diagnosis present

## 2017-03-03 DIAGNOSIS — Z8249 Family history of ischemic heart disease and other diseases of the circulatory system: Secondary | ICD-10-CM | POA: Diagnosis not present

## 2017-03-03 LAB — WET PREP, GENITAL
CLUE CELLS WET PREP: NONE SEEN
Sperm: NONE SEEN
Trich, Wet Prep: NONE SEEN
Yeast Wet Prep HPF POC: NONE SEEN

## 2017-03-03 LAB — CBC WITH DIFFERENTIAL/PLATELET
BASOS ABS: 0 10*3/uL (ref 0.0–0.1)
Basophils Relative: 0 %
EOS ABS: 0.1 10*3/uL (ref 0.0–0.7)
EOS PCT: 1 %
HCT: 39.9 % (ref 36.0–46.0)
Hemoglobin: 13.7 g/dL (ref 12.0–15.0)
Lymphocytes Relative: 27 %
Lymphs Abs: 2.7 10*3/uL (ref 0.7–4.0)
MCH: 29.1 pg (ref 26.0–34.0)
MCHC: 34.3 g/dL (ref 30.0–36.0)
MCV: 84.7 fL (ref 78.0–100.0)
Monocytes Absolute: 0.5 10*3/uL (ref 0.1–1.0)
Monocytes Relative: 5 %
NEUTROS PCT: 67 %
Neutro Abs: 6.5 10*3/uL (ref 1.7–7.7)
PLATELETS: 308 10*3/uL (ref 150–400)
RBC: 4.71 MIL/uL (ref 3.87–5.11)
RDW: 12.6 % (ref 11.5–15.5)
WBC: 9.9 10*3/uL (ref 4.0–10.5)

## 2017-03-03 LAB — URINALYSIS, ROUTINE W REFLEX MICROSCOPIC
Bilirubin Urine: NEGATIVE
GLUCOSE, UA: NEGATIVE mg/dL
Ketones, ur: NEGATIVE mg/dL
Leukocytes, UA: NEGATIVE
Nitrite: NEGATIVE
PH: 5 (ref 5.0–8.0)
Protein, ur: NEGATIVE mg/dL
SPECIFIC GRAVITY, URINE: 1.02 (ref 1.005–1.030)

## 2017-03-03 LAB — POCT PREGNANCY, URINE: Preg Test, Ur: NEGATIVE

## 2017-03-03 MED ORDER — NAPROXEN 500 MG PO TBEC: 500.0000 mg | DELAYED_RELEASE_TABLET | Freq: Two times a day (BID) | ORAL | 1 refills | Status: AC

## 2017-03-03 MED ORDER — KETOROLAC TROMETHAMINE 60 MG/2ML IM SOLN
60.0000 mg | Freq: Once | INTRAMUSCULAR | Status: AC
  Administered 2017-03-03: 60 mg via INTRAMUSCULAR
  Filled 2017-03-03: qty 2

## 2017-03-03 MED ORDER — TRAMADOL HCL 50 MG PO TABS: 50.0000 mg | ORAL_TABLET | Freq: Four times a day (QID) | ORAL | 0 refills | Status: AC | PRN

## 2017-03-03 NOTE — Discharge Instructions (Signed)
Endometriosis Endometriosis is a condition in which the tissue that lines the uterus (endometrium) grows outside of its normal location. The tissue may grow in many locations close to the uterus, but it commonly grows on the ovaries, fallopian tubes, vagina, or bowel. When the uterus sheds the endometrium every menstrual cycle, there is bleeding wherever the endometrial tissue is located. This can cause pain because blood is irritating to tissues that are not normally exposed to it. What are the causes? The cause of endometriosis is not known. What increases the risk? You may be more likely to develop endometriosis if you:  Have a family history of endometriosis.  Have never given birth.  Started your period at age 61 or younger.  Have high levels of estrogen in your body.  Were exposed to a certain medicine (diethylstilbestrol) before you were born (in utero).  Had low birth weight.  Were born as a twin, triplet, or other multiple.  Have a BMI of less than 25. BMI is an estimate of body fat and is calculated from height and weight. What are the signs or symptoms? Often, there are no symptoms of this condition. If you do have symptoms, they may:  Vary depending on where your endometrial tissue is growing.  Occur during your menstrual period (most common) or midcycle.  Come and go, or you may go months with no symptoms at all.  Stop with menopause. Symptoms may include:  Pain in the back or abdomen.  Heavier bleeding during periods.  Pain during sex.  Painful bowel movements.  Infertility.  Pelvic pain.  Bleeding more than once a month. How is this diagnosed? This condition is diagnosed based on your symptoms and a physical exam. You may have tests, such as:  Blood tests and urine tests. These may be done to help rule out other possible causes of your symptoms.  Ultrasound, to look for abnormal tissues.  An X-ray of the lower bowel (barium enema).  An ultrasound  that is done through the vagina (transvaginally).  CT scan.  MRI.  Laparoscopy. In this procedure, a lighted, pencil-sized instrument called a laparoscope is inserted into your abdomen through an incision. The laparoscope allows your health care provider to look at the organs inside your body and check for abnormal tissue to confirm the diagnosis. If abnormal tissue is found, your health care provider may remove a small piece of tissue (biopsy) to be examined under a microscope. How is this treated? Treatment for this condition may include:  Medicines to relieve pain, such as NSAIDs.  Hormone therapy. This involves using artificial (synthetic) hormones to reduce endometrial tissue growth. Your health care provider may recommend using a hormonal form of birth control, or other medicines.  Surgery. This may be done to remove abnormal endometrial tissue.  In some cases, tissue may be removed using a laparoscope and a laser (laparoscopic laser treatment).  In severe cases, surgery may be done to remove the fallopian tubes, uterus, and ovaries (hysterectomy). Follow these instructions at home:  Take over-the-counter and prescription medicines only as told by your health care provider.  Do not drive or use heavy machinery while taking prescription pain medicine.  Try to avoid activities that cause pain, including sexual activity.  Keep all follow-up visits as told by your health care provider. This is important. Contact a health care provider if:  You have pain in the area between your hip bones (pelvic area) that occurs:  Before, during, or after your period.  In  between your period and gets worse during your period.  During or after sex.  With bowel movements or urination, especially during your period.  You have problems getting pregnant.  You have a fever. Get help right away if:  You have severe pain that does not get better with medicine.  You have severe nausea and  vomiting, or you cannot eat without vomiting.  You have pain that affects only the lower, right side of your abdomen.  You have abdominal pain that gets worse.  You have abdominal swelling.  You have blood in your stool. This information is not intended to replace advice given to you by your health care provider. Make sure you discuss any questions you have with your health care provider. Document Released: 11/18/2000 Document Revised: 08/26/2016 Document Reviewed: 04/23/2016 Elsevier Interactive Patient Education  2017 [employer] Dysmenorrhea Dysmenorrhea means painful cramps during your period (menstrual period). You will have pain in your lower belly (abdomen). The pain is caused by the tightening (contracting) of the muscles of the womb (uterus). The pain may be mild or very bad. With this condition, you may:  Have a headache.  Feel sick to your stomach (nauseous).  Throw up (vomit).  Have lower back pain. Follow these instructions at home: Helping pain and cramping   Put heat on your lower back or belly when you have pain or cramps. Use the heat source that your doctor tells you to use.  Place a towel between your skin and the heat.  Leave the heat on for 20-30 minutes.  Remove the heat if your skin turns bright red. This is especially important if you cannot feel pain, heat, or cold.  Do not have a heating pad on during sleep.  Do aerobic exercises. These include walking, swimming, or biking. These may help with cramps.  Massage your lower back or belly. This may help lessen pain. General instructions   Take over-the-counter and prescription medicines only as told by your doctor.  Do not drive or use heavy machinery while taking prescription pain medicine.  Avoid alcohol and caffeine during and right before your period. These can make cramps worse.  Do not use any products that have nicotine or tobacco. These include cigarettes and e-cigarettes. If you need  help quitting, ask your doctor.  Keep all follow-up visits as told by your doctor. This is important. Contact a doctor if:  You have pain that gets worse.  You have pain that does not get better with medicine.  You have pain during sex.  You feel sick to your stomach or you throw up during your period, and medicine does not help. Get help right away if:  You pass out (faint). Summary  Dysmenorrhea means painful cramps during your period (menstrual period).  Put heat on your lower back or belly when you have pain or cramps.  Do exercises like walking, swimming, or biking to help with cramps.  Contact a doctor if you have pain during sex. This information is not intended to replace advice given to you by your health care provider. Make sure you discuss any questions you have with your health care provider. Document Released: 02/17/2009 Document Revised: 12/08/2016 Document Reviewed: 12/08/2016 Elsevier Interactive Patient Education  2017 [employer].

## 2017-03-03 NOTE — MAU Provider Note (Signed)
Chief Complaint:  lower abdominal pain   First Provider Initiated Contact with Patient 03/03/17 1334     HPI: Connie [facility] is a 31 y.o. [id number] who presents to maternity admissions reporting Heavy vaginal bleeding and clots.  Has severe right lower abdominal pain.  States has this every month.  States she has had an ovarian cyst in past and thinks this is what is causing her pain. Dr Helane Rima is her primary doctor. States they have discussed possible endometriosis before, but she never got around to having a laparoscopy. She reports vaginal bleeding, but no vaginal itching/burning, urinary symptoms, h/a, dizziness, n/v, or fever/chills.    Vaginal Bleeding  Associated symptoms include abdominal pain and nausea. Pertinent negatives include no anorexia, constipation, diarrhea, fever, frequency or vomiting. Nothing aggravates the symptoms. She has tried NSAIDs for the symptoms. The treatment provided no relief. It is unknown whether or not her partner has an STD. She uses nothing for contraception. Her menstrual history has been regular. Her past medical history is significant for menorrhagia and ovarian cysts.  Abdominal Pain  This is a recurrent problem. The current episode started in the past 7 days. The onset quality is gradual. The problem occurs constantly. The problem has been unchanged. The pain is located in the RLQ. The pain is severe. The quality of the pain is sharp. The abdominal pain radiates to the back. Associated symptoms include nausea. Pertinent negatives include no anorexia, constipation, diarrhea, fever, frequency, myalgias or vomiting. The pain is aggravated by palpation and movement. The pain is relieved by nothing. Treatments tried: ibuprofen. The treatment provided no relief.   RN Note: Heavy vaginal bleeding since Thursday of last week, a lot of clotting. Severe right sided lower abdominal pain. Hx of ovarian cyst.  Past Medical History: Past Medical History:  Diagnosis  Date  . ADHD (attention deficit hyperactivity disorder)   . Allergic rhinitis   . Anemia   . Asthma   . Depression    dx during her pregnancy in 2000  . GERD (gastroesophageal reflux disease)   . Migraine    dx during pregnancy    Past obstetric history: OB History  Gravida Para Term Preterm AB Living  2 1 1   1 1   SAB TAB Ectopic Multiple Live Births  1       1    # Outcome Date GA Lbr Len/2nd Weight Sex Delivery Anes PTL Lv  2 SAB 2013          1 Term 10/11/10 [redacted]w[redacted]d    Vag-Spont   LIV      Past Surgical History: Past Surgical History:  Procedure Laterality Date  . DILATION AND CURETTAGE OF UTERUS    . TMJ ARTHROPLASTY  2001  . TONSILLECTOMY    . WISDOM TOOTH EXTRACTION      Family History: Family History  Problem Relation Age of Onset  . Diabetes Mother   . Hypertension Mother   . Heart attack      great uncle   . Thyroid disease Father   . Atrial fibrillation Father   . Hypertension Father   . Thyroid disease Sister     Social History: Social History  Substance Use Topics  . Smoking status: Former Smoker    Quit date: 12/11/2009  . Smokeless tobacco: Never Used  . Alcohol use 0.0 oz/week     Comment: socially     Allergies:  Allergies  Allergen Reactions  . Zithromax [Azithromycin Dihydrate] Nausea Only  Meds:  Prescriptions Prior to Admission  Medication Sig Dispense Refill Last Dose  . albuterol (PROVENTIL HFA;VENTOLIN HFA) 108 (90 BASE) MCG/ACT inhaler Inhale 1-2 puffs into the lungs every 6 (six) hours as needed for wheezing or shortness of breath.   rescue  . amphetamine-dextroamphetamine (ADDERALL XR) 30 MG 24 hr capsule Take 30 mg by mouth daily.   04/30/2015 at 1800  . amphetamine-dextroamphetamine (ADDERALL) 20 MG tablet Take 20 mg by mouth daily at 3 pm.   04/30/2015 at Unknown time  . buPROPion (WELLBUTRIN SR) 150 MG 12 hr tablet Take 150 mg by mouth 2 (two) times daily.   04/30/2015 at 1700  . cetirizine (ZYRTEC) 10 MG tablet Take 10  mg by mouth daily.   Past Week at Unknown time  . clonazePAM (KLONOPIN) 1 MG tablet Take 1 mg by mouth 2 (two) times daily.     Marland Kitchen ibuprofen (ADVIL,MOTRIN) 200 MG tablet Take 800 mg by mouth every 6 (six) hours as needed for headache or moderate pain.   Past Week at Unknown time  . Multiple Vitamin (MULTIVITAMIN) capsule Take 1 capsule by mouth daily.     04/30/2015 at Unknown time  . norethindrone-ethinyl estradiol (OVCON-35,BALZIVA,BRIELLYN) 0.4-35 MG-MCG tablet Take 1 tablet by mouth daily.   04/30/2015 at Unknown time  . ondansetron (ZOFRAN) 4 MG tablet Take 1 tablet (4 mg total) by mouth every 6 (six) hours. 12 tablet 0     I have reviewed patient's Past Medical Hx, Surgical Hx, Family Hx, Social Hx, medications and allergies.  ROS:  Review of Systems  Constitutional: Negative for fever.  Gastrointestinal: Positive for abdominal pain and nausea. Negative for anorexia, constipation, diarrhea and vomiting.  Genitourinary: Positive for menorrhagia and vaginal bleeding. Negative for frequency.  Musculoskeletal: Negative for myalgias.   Other systems negative     Physical Exam  Patient Vitals for the past 24 hrs:  BP Temp Temp src Pulse Resp SpO2 Height Weight  03/03/17 1327 137/79 97.9 F (36.6 C) Oral (!) 112 18 99 % - -  03/03/17 1318 - - - - - - 5\' 7"  (1.702 m) 195 lb (88.5 kg)   Constitutional: Well-developed, well-nourished female in no acute distress.  Cardiovascular: normal rate and rhythm, no ectopy audible, S1 & S2 heard, no murmur Respiratory: normal effort, no distress. Lungs CTAB with no wheezes or crackles GI: Abd soft, non-tender.  Nondistended.  No rebound, No guarding.  Bowel Sounds audible  MS: Extremities nontender, no edema, normal ROM Neurologic: Alert and oriented x 4.   Grossly nonfocal. GU: Neg CVAT. Skin:  Warm and Dry Psych:  Affect appropriate.  PELVIC EXAM: Cervix pink, visually closed, without lesion, small bloody mucous, vaginal walls and external  genitalia normal Bimanual exam: Cervix firm, anterior, neg CMT, uterus nontender, nonenlarged, adnexa without tenderness, enlargement, or mass    Labs:    Results for orders placed or performed during the hospital encounter of 03/03/17 (from the past 24 hour(s))  Urinalysis, Routine w reflex microscopic     Status: Abnormal   Collection Time: 03/03/17  1:15 PM  Result Value Ref Range   Color, Urine YELLOW YELLOW   APPearance CLEAR CLEAR   Specific Gravity, Urine 1.020 1.005 - 1.030   pH 5.0 5.0 - 8.0   Glucose, UA NEGATIVE NEGATIVE mg/dL   Hgb urine dipstick LARGE (A) NEGATIVE   Bilirubin Urine NEGATIVE NEGATIVE   Ketones, ur NEGATIVE NEGATIVE mg/dL   Protein, ur NEGATIVE NEGATIVE mg/dL   Nitrite NEGATIVE  NEGATIVE   Leukocytes, UA NEGATIVE NEGATIVE   RBC / HPF 0-5 0 - 5 RBC/hpf   WBC, UA 0-5 0 - 5 WBC/hpf   Bacteria, UA RARE (A) NONE SEEN   Squamous Epithelial / LPF 0-5 (A) NONE SEEN   Mucous PRESENT   Pregnancy, urine POC     Status: None   Collection Time: 03/03/17  1:22 PM  Result Value Ref Range   Preg Test, Ur NEGATIVE NEGATIVE  Wet prep, genital     Status: Abnormal   Collection Time: 03/03/17  1:49 PM  Result Value Ref Range   Yeast Wet Prep HPF POC NONE SEEN NONE SEEN   Trich, Wet Prep NONE SEEN NONE SEEN   Clue Cells Wet Prep HPF POC NONE SEEN NONE SEEN   WBC, Wet Prep HPF POC MODERATE (A) NONE SEEN   Sperm NONE SEEN   CBC with Differential     Status: None   Collection Time: 03/03/17  2:26 PM  Result Value Ref Range   WBC 9.9 4.0 - 10.5 K/uL   RBC 4.71 3.87 - 5.11 MIL/uL   Hemoglobin 13.7 12.0 - 15.0 g/dL   HCT 39.9 36.0 - 46.0 %   MCV 84.7 78.0 - 100.0 fL   MCH 29.1 26.0 - 34.0 pg   MCHC 34.3 30.0 - 36.0 g/dL   RDW 12.6 11.5 - 15.5 %   Platelets 308 150 - 400 K/uL   Neutrophils Relative % 67 %   Neutro Abs 6.5 1.7 - 7.7 K/uL   Lymphocytes Relative 27 %   Lymphs Abs 2.7 0.7 - 4.0 K/uL   Monocytes Relative 5 %   Monocytes Absolute 0.5 0.1 - 1.0 K/uL    Eosinophils Relative 1 %   Eosinophils Absolute 0.1 0.0 - 0.7 K/uL   Basophils Relative 0 %   Basophils Absolute 0.0 0.0 - 0.1 K/uL     Imaging:  No results found.  MAU Course/MDM: I have ordered labs as follows:  CBC to check hemoglobin and WBC (to help rule out appendicitis), UPT, UA Imaging ordered: Pelvic ultrasound to rule out torsion due to severity of pain  This was negative, no cyst seen Results reviewed.  Normal hemoglobin, normal WBC.  Negative UA. Negative UPT  Consult Dr Julien Girt.  She recommends conservative care, naproxen, very small supply of Ultram.  Patient will follow with appt with Dr Helane Rima for further evaluation and planning. Warned of possible pregnancy without contraception.  Treatments in MAU included Toradol, which pt states did not help much.   Pt stable at time of discharge.  Assessment: RLQ abdominal pain - Plan: US Pelvis Complete, US Pelvis Complete, US Transvaginal Non-OB, US Transvaginal Non-OB, Discharge patient   Plan: Discharge home Recommend Follow up in office with Dr Helane Rima Rx sent for Naproxen for dysmenorrhea (don't use with ibuprofen) Rx sent for Ultram x 24 hrs for severe pain  Encouraged to return here or to other Urgent Care/ED if she develops worsening of symptoms, increase in pain, fever, or other concerning symptoms.   Hansel Feinstein CNM, MSN Certified Nurse-Midwife 03/03/2017 1:35 PM

## 2017-03-03 NOTE — MAU Note (Signed)
Heavy vaginal bleeding since Thursday of last week, a lot of clotting. Severe right sided lower abdominal pain. Hx of ovarian cyst.

## 2017-03-06 LAB — GC/CHLAMYDIA PROBE AMP (~~LOC~~) NOT AT ARMC
CHLAMYDIA, DNA PROBE: NEGATIVE
NEISSERIA GONORRHEA: NEGATIVE

## 2017-07-29 ENCOUNTER — Ambulatory Visit (HOSPITAL_COMMUNITY)
Admission: EM | Admit: 2017-07-29 | Discharge: 2017-07-29 | Disposition: A | Discharge status: Home / Self Care | Payer: BLUE CROSS/BLUE SHIELD | Attending: Family Medicine | Admitting: Family Medicine

## 2017-07-29 ENCOUNTER — Encounter (HOSPITAL_COMMUNITY): Payer: Self-pay | Admitting: Emergency Medicine

## 2017-07-29 DIAGNOSIS — S81811A Laceration without foreign body, right lower leg, initial encounter: Secondary | ICD-10-CM | POA: Diagnosis not present

## 2017-07-29 DIAGNOSIS — Z23 Encounter for immunization: Secondary | ICD-10-CM

## 2017-07-29 DIAGNOSIS — W268XXA Contact with other sharp object(s), not elsewhere classified, initial encounter: Secondary | ICD-10-CM

## 2017-07-29 DIAGNOSIS — J0100 Acute maxillary sinusitis, unspecified: Secondary | ICD-10-CM | POA: Diagnosis not present

## 2017-07-29 MED ORDER — TETANUS-DIPHTH-ACELL PERTUSSIS 5-2.5-18.5 LF-MCG/0.5 IM SUSP
0.5000 mL | Freq: Once | INTRAMUSCULAR | Status: AC
  Administered 2017-07-29: 0.5 mL via INTRAMUSCULAR

## 2017-07-29 MED ORDER — TETANUS-DIPHTH-ACELL PERTUSSIS 5-2.5-18.5 LF-MCG/0.5 IM SUSP
INTRAMUSCULAR | Status: AC
  Filled 2017-07-29: qty 0.5

## 2017-07-29 MED ORDER — AMOXICILLIN-POT CLAVULANATE 875-125 MG PO TABS: 1.0000 | ORAL_TABLET | Freq: Two times a day (BID) | ORAL | 0 refills | Status: AC

## 2017-07-29 NOTE — ED Provider Notes (Signed)
  [place]   [id number] 07/29/17 Arrival Time: 6948  ASSESSMENT & PLAN:  1. Laceration of right lower extremity, initial encounter   2. Acute non-recurrent maxillary sinusitis     Meds ordered this encounter  Medications  . amoxicillin-clavulanate (AUGMENTIN) 875-125 MG tablet    Sig: Take 1 tablet by mouth every 12 (twelve) hours.    Dispense:  20 tablet    Refill:  0  . Tdap (BOOSTRIX) injection 0.5 mL   Superficial wound over R anterior thigh cleaned. Steri-strips applied to approximate edges. No sutures needed. Local wound care instructions given. Also treating her for sinusitis secondary to recent allergy exacerbation.  Reviewed expectations re: course of current medical issues. Questions answered. Outlined signs and symptoms indicating need for more acute intervention. Patient verbalized understanding. After Visit Summary given.   SUBJECTIVE:  Connie Lewis is a 31 y.o. female who presents with a laceration to her R anterior thigh. Taking garbage out today and thinks a broken dish was inside. Felt something sharp across her leg and saw her leggings were torn. Moderate bleeding from wound, now stopped. No trouble with ambulation.  Also with recent allergy exacerbation for the past 2 weeks. Persistent nasal congestion. Now with facial pressure, worse when leaning forward. Feels in her teeth. No resp symptoms. No n/v. Afebrile. H/O sinusitis.  Td UTD: No  ROS: As per HPI.   OBJECTIVE:  Vitals:   07/29/17 1440  BP: 139/71  Pulse: (!) 109  Resp: 20  Temp: 98.6 F (37 C)  TempSrc: Oral  SpO2: 99%     General appearance: alert; no distress HENT: nasal congestion; maxillary sinus tenderness to palpation; throat irritation secondary to post-nasal drainage Neck: supple without LAD Skin: superficial laceration of R anterior thigh; no bleeding Psychological: alert and cooperative; normal mood and affect   Allergies  Allergen Reactions  .  Zithromax [Azithromycin Dihydrate] Nausea Only    Past Medical History:  Diagnosis Date  . ADHD (attention deficit hyperactivity disorder)   . Allergic rhinitis   . Anemia   . Asthma   . Depression    dx during her pregnancy in 2000  . GERD (gastroesophageal reflux disease)   . Migraine    dx during pregnancy        Vanessa Kick, MD 07/29/17 [phone]

## 2017-07-29 NOTE — ED Triage Notes (Signed)
Pt here for lac to RLE onset 1230  Reports as she was throwing her trash bag out in the trash bin, she caught her leg w/a broken dish that was inside the bag  Bleeding controlled  Last tetanus = unknown  A&O x4... NAD.Marland Kitchen. ambulatory

## 2018-05-21 ENCOUNTER — Emergency Department (HOSPITAL_BASED_OUTPATIENT_CLINIC_OR_DEPARTMENT_OTHER)
Admission: EM | Admit: 2018-05-21 | Discharge: 2018-05-21 | Disposition: A | Discharge status: Home / Self Care | Payer: BLUE CROSS/BLUE SHIELD | Attending: Physician Assistant | Admitting: Physician Assistant

## 2018-05-21 ENCOUNTER — Encounter (HOSPITAL_BASED_OUTPATIENT_CLINIC_OR_DEPARTMENT_OTHER): Payer: Self-pay | Admitting: Emergency Medicine

## 2018-05-21 ENCOUNTER — Other Ambulatory Visit: Payer: Self-pay

## 2018-05-21 DIAGNOSIS — R51 Headache: Secondary | ICD-10-CM | POA: Diagnosis present

## 2018-05-21 DIAGNOSIS — Z79899 Other long term (current) drug therapy: Secondary | ICD-10-CM | POA: Insufficient documentation

## 2018-05-21 DIAGNOSIS — J45909 Unspecified asthma, uncomplicated: Secondary | ICD-10-CM | POA: Diagnosis not present

## 2018-05-21 DIAGNOSIS — R519 Headache, unspecified: Secondary | ICD-10-CM

## 2018-05-21 DIAGNOSIS — Z87891 Personal history of nicotine dependence: Secondary | ICD-10-CM | POA: Diagnosis not present

## 2018-05-21 MED ORDER — SUMATRIPTAN SUCCINATE 6 MG/0.5ML ~~LOC~~ SOLN
6.0000 mg | Freq: Once | SUBCUTANEOUS | Status: AC
  Administered 2018-05-21: 6 mg via SUBCUTANEOUS
  Filled 2018-05-21: qty 0.5

## 2018-05-21 MED ORDER — METOCLOPRAMIDE HCL 10 MG PO TABS: 10.0000 mg | ORAL_TABLET | Freq: Four times a day (QID) | ORAL | 0 refills | Status: AC | PRN

## 2018-05-21 MED ORDER — SUMATRIPTAN SUCCINATE 25 MG PO TABS: 25.0000 mg | ORAL_TABLET | ORAL | 0 refills | Status: AC | PRN

## 2018-05-21 NOTE — Discharge Instructions (Signed)
It was my pleasure taking care of you today!  Drink plenty of fluids at home. This will help with your headache.  Please follow up with your primary doctor.    Fortunately, your evaluation today is reassuring with no apparent emergent cause for your headache at this time. With that being said, it is VERY important that you monitor your symptoms at home. If you develop worsening headache, new fever, rash, weakness, numbness, trouble with your speech, trouble walking, new or worsening symptoms or any concerning symptoms, please return to the ED immediately.

## 2018-05-21 NOTE — ED Provider Notes (Signed)
[facility] Provider Note   CSN: [id number] Arrival date & time: 05/21/18  1903     History   Chief Complaint Chief Complaint  Patient presents with  . Headache    HPI Connie Lewis is a 32 y.o. female.  The history is provided by the patient and medical records. No language interpreter was used.  Headache     Connie Lewis is a 32 y.o. female  with a PMH of migraines who presents to the Emergency Department complaining of left-sided headache x 2 days associated symptoms include nausea and photophobia.  Consistent with her typical migraine headaches.  She typically will take her home Imitrex and get improvement in her symptoms, however she has been out of this medication.  Denies any fever or chills.  No dizziness, visual changes, numbness or tingling.  No neck pain.  Past Medical History:  Diagnosis Date  . ADHD (attention deficit hyperactivity disorder)   . Allergic rhinitis   . Anemia   . Asthma   . Depression    dx during her pregnancy in 2000  . GERD (gastroesophageal reflux disease)   . Migraine    dx during pregnancy    Patient Active Problem List   Diagnosis Date Noted  . Neck injury 01/05/2015  . Lower back injury 01/05/2015  . Migraines 02/03/2012  . Allergic conjunctivitis 11/03/2011  . BACK PAIN 01/05/2011  . TREMOR 01/05/2011  . DEPRESSION 11/17/2010  . MOLE 11/24/2008  . ACNE VULGARIS 11/24/2008  . ADHD 10/22/2007  . ALLERGIC RHINITIS 10/22/2007  . ASTHMA 10/22/2007  . GERD 10/22/2007    Past Surgical History:  Procedure Laterality Date  . DILATION AND CURETTAGE OF UTERUS    . TMJ ARTHROPLASTY  2001  . TONSILLECTOMY    . WISDOM TOOTH EXTRACTION       OB History    Gravida  2   Para  1   Term  1   Preterm      AB  1   Living  1     SAB  1   TAB      Ectopic      Multiple      Live Births  1            Home Medications    Prior to Admission medications   Medication Sig  Start Date End Date Taking? Authorizing Provider  amoxicillin-clavulanate (AUGMENTIN) 875-125 MG tablet Take 1 tablet by mouth every 12 (twelve) hours. 07/29/17  Yes Vanessa Kick, MD  albuterol (PROVENTIL HFA;VENTOLIN HFA) 108 (90 BASE) MCG/ACT inhaler Inhale 1-2 puffs into the lungs every 6 (six) hours as needed for wheezing or shortness of breath.    [provider]  ALPRAZolam Duanne Moron) 1 MG tablet Take 1 tablet by mouth 2 (two) times daily. 02/28/17   [provider]  amphetamine-dextroamphetamine (ADDERALL XR) 30 MG 24 hr capsule Take 30 mg by mouth daily.    [provider]  amphetamine-dextroamphetamine (ADDERALL) 20 MG tablet Take 20 mg by mouth daily at 3 pm.    [provider]  buPROPion (WELLBUTRIN XL) 300 MG 24 hr tablet Take 300 mg by mouth daily.    [provider]  cetirizine (ZYRTEC) 10 MG tablet Take 10 mg by mouth daily.    [provider]  clonazePAM (KLONOPIN) 1 MG tablet Take 1 mg by mouth 2 (two) times daily.    [provider]  metoCLOPramide (REGLAN) 10 MG tablet Take  1 tablet (10 mg total) by mouth every 6 (six) hours as needed for nausea. 05/21/18   Iyari Hagner, Ozella Almond, PA-C  Multiple Vitamin (MULTIVITAMIN) capsule Take 1 capsule by mouth daily.      [provider]  naproxen (EC NAPROSYN) 500 MG EC tablet Take 1 tablet (500 mg total) by mouth 2 (two) times daily with a meal. 03/03/17   Seabron Spates, CNM  SUMAtriptan (IMITREX) 25 MG tablet Take 1 tablet (25 mg total) by mouth every 2 (two) hours as needed for migraine. May repeat in 2 hours if headache persists or recurs. 05/21/18   Harutyun Monteverde, Ozella Almond, PA-C  traMADol (ULTRAM) 50 MG tablet Take 1 tablet (50 mg total) by mouth every 6 (six) hours as needed. 03/03/17   Seabron Spates, CNM    Family History Family History  Problem Relation Age of Onset  . Diabetes Mother   . Hypertension Mother   . Heart attack Unknown        great uncle   . Thyroid  disease Father   . Atrial fibrillation Father   . Hypertension Father   . Thyroid disease Sister     Social History Social History   Tobacco Use  . Smoking status: Former Smoker    Last attempt to quit: 12/11/2009    Years since quitting: 8.4  . Smokeless tobacco: Never Used  Substance Use Topics  . Alcohol use: Yes    Alcohol/week: 0.0 oz    Comment: socially   . Drug use: No     Allergies   Zithromax [azithromycin dihydrate]   Review of Systems Review of Systems  Neurological: Positive for headaches. Negative for dizziness, syncope, weakness and numbness.  All other systems reviewed and are negative.    Physical Exam Updated Vital Signs BP 91/73 (BP Location: Right Arm)   Pulse 97   Temp 99 F (37.2 C) (Oral)   Resp 16   Ht 5\' 7"  (1.702 m)   Wt 79.4 kg (175 lb)   LMP 05/15/2018   SpO2 97%   BMI 27.41 kg/m   Physical Exam  Constitutional: She is oriented to person, place, and time. She appears well-developed and well-nourished. No distress.  HENT:  Head: Normocephalic and atraumatic.  Mouth/Throat: Oropharynx is clear and moist.  No tenderness of the temporal artery   Eyes: Pupils are equal, round, and reactive to light. Conjunctivae and EOM are normal. No scleral icterus.  No nystagmus   Neck: Normal range of motion. Neck supple.  Full active and passive ROM without pain.  No midline or paraspinal tenderness. No nuchal rigidity or meningeal signs.  Cardiovascular: Normal rate, regular rhythm, normal heart sounds and intact distal pulses.  Pulmonary/Chest: Effort normal and breath sounds normal. No respiratory distress. She has no wheezes. She has no rales.  Abdominal: Soft. Bowel sounds are normal. She exhibits no distension. There is no tenderness. There is no rebound and no guarding.  Musculoskeletal: Normal range of motion.  Lymphadenopathy:    She has no cervical adenopathy.  Neurological: She is alert and oriented to person, place, and time. She  has normal reflexes. No cranial nerve deficit. Coordination normal.  Alert, oriented, thought content appropriate, able to give a coherent history. Speech is clear and goal oriented, able to follow commands.  Cranial Nerves:  II:  Peripheral visual fields grossly normal, pupils equal, round, reactive to light III, IV, VI: EOM intact bilaterally, ptosis not present V,VII: smile symmetric, eyes kept closed tightly against  resistance, facial light touch sensation equal VIII: hearing grossly normal IX, X: symmetric soft palate movement, uvula elevates symmetrically  XI: bilateral shoulder shrug symmetric and strong XII: midline tongue extension 5/5 muscle strength in upper and lower extremities bilaterally including strong and equal grip strength and dorsiflexion/plantar flexion Sensory to light touch normal in all four extremities.  Normal finger-to-nose and rapid alternating movements. No drift. Steady gait.  Skin: Skin is warm and dry. No rash noted. She is not diaphoretic.  Nursing note and vitals reviewed.    ED Treatments / Results  Labs (all labs ordered are listed, but only abnormal results are displayed) Labs Reviewed - No data to display  EKG None  Radiology No results found.  Procedures Procedures (including critical care time)  Medications Ordered in ED Medications  SUMAtriptan (IMITREX) injection 6 mg (6 mg Subcutaneous Given 05/21/18 2126)     Initial Impression / Assessment and Plan / ED Course  I have reviewed the triage vital signs and the nursing notes.  Pertinent labs & imaging results that were available during my care of the patient were reviewed by me and considered in my medical decision making (see chart for details).   [facility] is a 32 y.o. female who presents to ED for headache c/w their typical migraine. No focal neuro deficits on exam. Recommended migraine cocktail, however she states that she typically takes Imitrex, but is out of her home  medications. She is curious if she could could just get imitrex injection instead which was given. She did have notable improvement in symptoms. Will refill home meds of imitrex and reglan. The patient denies any neurologic symptoms such as visual changes, focal numbness/weakness, balance problems, confusion, or speech difficulty to suggest a life-threatening intracranial process such as intracranial hemorrhage or mass. The patient has no clotting risk factors thus venous sinus thrombosis is unlikely. No fevers, neck pain or nuchal rigidity to suggest meningitis. I feel that the patient is safe for discharge home at this time. PCP follow up strongly encouraged. I have reviewed return precautions including development of neurologic symptoms, confusion, lethargy, difficulty speaking, or new/worsening/concerning symptoms. All questions answered.   Final Clinical Impressions(s) / ED Diagnoses   Final diagnoses:  Bad headache    ED Discharge Orders        Ordered    metoCLOPramide (REGLAN) 10 MG tablet  Every 6 hours PRN     05/21/18 2150    SUMAtriptan (IMITREX) 25 MG tablet  Every 2 hours PRN     05/21/18 2150       Tashe Purdon, Ozella Almond, PA-C 05/21/18 2230    Macarthur Critchley, MD 05/21/18 2358

## 2018-05-21 NOTE — ED Triage Notes (Signed)
Headache x 2 days with nausea. Feels like usual migraine. Pt is out of imitrex.
